# Patient Record
Sex: Female | Born: 1945
Health system: Southern US, Community
[De-identification: ages and names within clinical notes are randomized; demographics above are authoritative.]

## PROBLEM LIST (undated history)

## (undated) DIAGNOSIS — I10 Essential (primary) hypertension: Secondary | ICD-10-CM

## (undated) DIAGNOSIS — K8020 Calculus of gallbladder without cholecystitis without obstruction: Secondary | ICD-10-CM

## (undated) HISTORY — DX: Calculus of gallbladder without cholecystitis without obstruction: K80.20

## (undated) HISTORY — DX: Essential (primary) hypertension: I10

---

## 2002-08-30 ENCOUNTER — Emergency Department (HOSPITAL_COMMUNITY): Admission: AD | Admit: 2002-08-30 | Discharge: 2002-08-30 | Payer: Self-pay | Admitting: Emergency Medicine

## 2005-02-21 ENCOUNTER — Observation Stay (HOSPITAL_COMMUNITY): Admission: EM | Admit: 2005-02-21 | Discharge: 2005-02-23 | Payer: Self-pay | Admitting: *Deleted

## 2011-01-21 ENCOUNTER — Emergency Department (HOSPITAL_COMMUNITY): Payer: Self-pay

## 2011-01-21 ENCOUNTER — Emergency Department (HOSPITAL_COMMUNITY)
Admission: EM | Admit: 2011-01-21 | Discharge: 2011-01-22 | Disposition: A | Discharge status: Home / Self Care | Payer: Self-pay | Attending: Emergency Medicine | Admitting: Emergency Medicine

## 2011-01-21 DIAGNOSIS — R509 Fever, unspecified: Secondary | ICD-10-CM | POA: Insufficient documentation

## 2011-01-21 DIAGNOSIS — I10 Essential (primary) hypertension: Secondary | ICD-10-CM | POA: Insufficient documentation

## 2011-01-21 DIAGNOSIS — D259 Leiomyoma of uterus, unspecified: Secondary | ICD-10-CM | POA: Insufficient documentation

## 2011-01-21 DIAGNOSIS — R51 Headache: Secondary | ICD-10-CM | POA: Insufficient documentation

## 2011-01-21 DIAGNOSIS — K8020 Calculus of gallbladder without cholecystitis without obstruction: Secondary | ICD-10-CM | POA: Insufficient documentation

## 2011-01-21 LAB — URINALYSIS, ROUTINE W REFLEX MICROSCOPIC
Glucose, UA: NEGATIVE mg/dL
Ketones, ur: NEGATIVE mg/dL
Nitrite: NEGATIVE
Protein, ur: 100 mg/dL — AB
Specific Gravity, Urine: 1.023 (ref 1.005–1.030)
Urobilinogen, UA: 1 mg/dL (ref 0.0–1.0)
pH: 6 (ref 5.0–8.0)

## 2011-01-21 LAB — CBC
HCT: 42.4 % (ref 36.0–46.0)
Hemoglobin: 14.6 g/dL (ref 12.0–15.0)
MCH: 28.1 pg (ref 26.0–34.0)
MCV: 81.7 fL (ref 78.0–100.0)
Platelets: 256 10*3/uL (ref 150–400)
RBC: 5.19 MIL/uL — ABNORMAL HIGH (ref 3.87–5.11)
RDW: 14.8 % (ref 11.5–15.5)
WBC: 14.7 10*3/uL — ABNORMAL HIGH (ref 4.0–10.5)

## 2011-01-21 LAB — DIFFERENTIAL
Basophils Absolute: 0 10*3/uL (ref 0.0–0.1)
Basophils Relative: 0 % (ref 0–1)
Eosinophils Absolute: 0 10*3/uL (ref 0.0–0.7)
Lymphocytes Relative: 10 % — ABNORMAL LOW (ref 12–46)
Lymphs Abs: 1.4 10*3/uL (ref 0.7–4.0)
Monocytes Absolute: 1.2 10*3/uL — ABNORMAL HIGH (ref 0.1–1.0)
Neutro Abs: 12 10*3/uL — ABNORMAL HIGH (ref 1.7–7.7)
Neutrophils Relative %: 82 % — ABNORMAL HIGH (ref 43–77)

## 2011-01-21 LAB — LIPASE, BLOOD: Lipase: 16 U/L (ref 11–59)

## 2011-01-21 LAB — COMPREHENSIVE METABOLIC PANEL
AST: 28 U/L (ref 0–37)
Alkaline Phosphatase: 105 U/L (ref 39–117)
BUN: 16 mg/dL (ref 6–23)
CO2: 29 mEq/L (ref 19–32)
Chloride: 97 mEq/L (ref 96–112)
Creatinine, Ser: 0.77 mg/dL (ref 0.50–1.10)
GFR calc Af Amer: >90 mL/min (ref >90)
GFR calc non Af Amer: 88 mL/min — ABNORMAL LOW (ref >90)
Potassium: 3.2 mEq/L — ABNORMAL LOW (ref 3.5–5.1)
Sodium: 137 mEq/L (ref 135–145)
Total Bilirubin: 0.5 mg/dL (ref 0.3–1.2)
Total Protein: 8.4 g/dL — ABNORMAL HIGH (ref 6.0–8.3)

## 2011-01-21 LAB — URINE MICROSCOPIC-ADD ON

## 2011-01-21 MED ORDER — IOHEXOL 300 MG/ML  SOLN
100.0000 mL | Freq: Once | INTRAMUSCULAR | Status: AC | PRN
  Administered 2011-01-21: 100 mL via INTRAVENOUS

## 2011-01-23 LAB — URINE CULTURE
Colony Count: NO GROWTH
Culture  Setup Time: 201210210413
Culture: NO GROWTH

## 2011-01-31 ENCOUNTER — Ambulatory Visit (INDEPENDENT_AMBULATORY_CARE_PROVIDER_SITE_OTHER): Payer: Self-pay | Admitting: General Surgery

## 2011-01-31 ENCOUNTER — Encounter (INDEPENDENT_AMBULATORY_CARE_PROVIDER_SITE_OTHER): Payer: Self-pay | Admitting: General Surgery

## 2011-01-31 VITALS — BP 154/92 | HR 84 | Temp 98.2°F | Resp 16 | Ht 65.0 in | Wt 205.2 lb

## 2011-01-31 DIAGNOSIS — K8010 Calculus of gallbladder with chronic cholecystitis without obstruction: Secondary | ICD-10-CM

## 2011-01-31 NOTE — Progress Notes (Signed)
Subjective:     Patient ID: Mykala Mccready, female   DOB: 05/22/1945, 65 y.o.   MRN: [id number]  HPIMiss Gural is a 65 year old female who is in the emergency room last Saturday that she awoke in the morning with episode of pain and in 2 weeks earlier she had a similar so that she did not get to the emergency room she is retired she used to work for the city as Designer, jewellery and she is to see a physician on a regular basis [place] and was told that she had high blood pressure but she's not on any blood pressure medications at this time in the emergency room she was same and [facility] who did laboratory studies she had a mildly elevated white count with left shift a CT was then performed and this showed a distended gallbladder but no definite stone she's got a little bit of a fatty infiltrate of her liver and then I did an ultrasound that showed numerous little small stones that, layer I. in the neck of the gallbladder or f his were normal and he gave her a prescription for Vicodin 5 325 and suggested she see a surgeon and she is here today now approximately 3 days later she says she still problems with nausea but not as bad of pain and she's not actually been vomiting   The patient states that her pain is not as severe but she is still having nausea and if you push in the right corpora abdomen she still somewhat sore   Review of SystemsHistory reviewed. No pertinent past surgical history. No Known Allergies Current Outpatient Prescriptions  Medication Sig Dispense Refill  . HYDROcodone-acetaminophen (NORCO) 5-325 MG per tablet Take 1 tablet by mouth every 6 (six) hours as needed.          In the emergency room duct status and an EKG that showed this changes consistent with ventricular mitral hypertrophy and looked like she had possibly an old MI sometime in the past but they located another x-ray another EKG in 2006 for comparison and they have been no change as been on blood  pressure medications during that interval but she is normal blood pressure medications at this time she says it is not do a chest x-ray eyes ears nose and throat negative chest she does not smoke cigarettes and denies any angina or chest type symptoms both of her episodes of gallbladder colic did not radiate to her chest or left arm. Abdomen she is at her previous hysterectomy no abdominal surgery    Objective:   Physical ExamBP 154/92  Pulse 84  Temp(Src) 98.2 F (36.8 C) (Temporal)  Resp 16  Ht 5\' 5"  (1.651 m)  Wt 205 lb 3.2 oz (93.078 kg)  BMI 34.15 kg/m2 HEENT normal no carotid bruits no cervical supraclavicular lymphadenopathy a did not do a breast exam her lungs are clear cardiac normal sinus rhythm abdomen she is mildly tender in the right upper quadrant she is moderately overweight but I don't appreciate any obvious hernias even though the CT showed a little weakness at the table rectal examination is brown stool Hemoccult negative C&S is physiologic     Assessment:    Symptomatic gallstones small gallstones with an elevated white count when she was in the ER Saturday still mildly tender and she may have to have a subacute cholecystitis I would recommend that we go ahead and proceed on Monday, urgent cholecystectomy I can schedule her for Thursday day  after tomorrow. She'll bring a bag and understands that if her stones in the bile duct husband and I if she would elect to go home after which her husband is retired and will be with her we will m that decision about whether this would go home in the recovery room in     Plan:    Schedule choleystectomy with x-ray Thursday at [facility]

## 2011-01-31 NOTE — Patient Instructions (Signed)
Low-fat small food and did not eating bed and see if we can schedule her surgery for Thursday of this week

## 2011-02-02 ENCOUNTER — Other Ambulatory Visit (INDEPENDENT_AMBULATORY_CARE_PROVIDER_SITE_OTHER): Payer: Self-pay | Admitting: General Surgery

## 2011-02-02 ENCOUNTER — Ambulatory Visit (HOSPITAL_COMMUNITY): Payer: Self-pay

## 2011-02-02 ENCOUNTER — Ambulatory Visit (HOSPITAL_COMMUNITY)
Admission: RE | Admit: 2011-02-02 | Discharge: 2011-02-03 | Disposition: A | Discharge status: Home / Self Care | Payer: Self-pay | Source: Ambulatory Visit | Attending: General Surgery | Admitting: General Surgery

## 2011-02-02 DIAGNOSIS — K8000 Calculus of gallbladder with acute cholecystitis without obstruction: Secondary | ICD-10-CM | POA: Insufficient documentation

## 2011-02-02 DIAGNOSIS — Z9119 Patient's noncompliance with other medical treatment and regimen: Secondary | ICD-10-CM | POA: Insufficient documentation

## 2011-02-02 DIAGNOSIS — I10 Essential (primary) hypertension: Secondary | ICD-10-CM | POA: Insufficient documentation

## 2011-02-02 DIAGNOSIS — Z91199 Patient's noncompliance with other medical treatment and regimen due to unspecified reason: Secondary | ICD-10-CM | POA: Insufficient documentation

## 2011-02-02 LAB — COMPREHENSIVE METABOLIC PANEL
AST: 38 U/L — ABNORMAL HIGH (ref 0–37)
Albumin: 3.5 g/dL (ref 3.5–5.2)
BUN: 9 mg/dL (ref 6–23)
CO2: 30 mEq/L (ref 19–32)
Calcium: 9.8 mg/dL (ref 8.4–10.5)
Chloride: 101 mEq/L (ref 96–112)
Creatinine, Ser: 0.85 mg/dL (ref 0.50–1.10)
GFR calc Af Amer: 83 mL/min — ABNORMAL LOW (ref >90)
GFR calc non Af Amer: 71 mL/min — ABNORMAL LOW (ref >90)
Glucose, Bld: 101 mg/dL — ABNORMAL HIGH (ref 70–99)
Potassium: 3.5 mEq/L (ref 3.5–5.1)
Total Bilirubin: 0.2 mg/dL — ABNORMAL LOW (ref 0.3–1.2)
Total Protein: 8 g/dL (ref 6.0–8.3)

## 2011-02-02 LAB — DIFFERENTIAL
Basophils Absolute: 0 10*3/uL (ref 0.0–0.1)
Eosinophils Absolute: 0.2 10*3/uL (ref 0.0–0.7)
Eosinophils Relative: 3 % (ref 0–5)
Lymphocytes Relative: 19 % (ref 12–46)
Lymphs Abs: 1.5 10*3/uL (ref 0.7–4.0)
Monocytes Absolute: 0.6 10*3/uL (ref 0.1–1.0)
Monocytes Relative: 8 % (ref 3–12)
Neutrophils Relative %: 70 % (ref 43–77)

## 2011-02-02 LAB — CBC
HCT: 39.2 % (ref 36.0–46.0)
Hemoglobin: 12.9 g/dL (ref 12.0–15.0)
MCH: 27.3 pg (ref 26.0–34.0)
MCHC: 32.9 g/dL (ref 30.0–36.0)
MCV: 83.1 fL (ref 78.0–100.0)
Platelets: 361 10*3/uL (ref 150–400)
RBC: 4.72 MIL/uL (ref 3.87–5.11)
RDW: 14.1 % (ref 11.5–15.5)
WBC: 7.7 10*3/uL (ref 4.0–10.5)

## 2011-02-02 LAB — SURGICAL PCR SCREEN
MRSA, PCR: NEGATIVE
Staphylococcus aureus: NEGATIVE

## 2011-02-03 HISTORY — PX: CHOLECYSTECTOMY: SHX55

## 2011-02-03 LAB — COMPREHENSIVE METABOLIC PANEL
AST: 44 U/L — ABNORMAL HIGH (ref 0–37)
Alkaline Phosphatase: 99 U/L (ref 39–117)
BUN: 6 mg/dL (ref 6–23)
CO2: 31 mEq/L (ref 19–32)
Chloride: 102 mEq/L (ref 96–112)
Creatinine, Ser: 0.81 mg/dL (ref 0.50–1.10)
GFR calc non Af Amer: 76 mL/min — ABNORMAL LOW (ref >90)
Total Bilirubin: 0.3 mg/dL (ref 0.3–1.2)

## 2011-02-09 ENCOUNTER — Ambulatory Visit (INDEPENDENT_AMBULATORY_CARE_PROVIDER_SITE_OTHER): Payer: Self-pay | Admitting: General Surgery

## 2011-02-09 ENCOUNTER — Encounter (INDEPENDENT_AMBULATORY_CARE_PROVIDER_SITE_OTHER): Payer: Self-pay | Admitting: General Surgery

## 2011-02-09 VITALS — BP 198/116 | HR 76 | Temp 98.8°F | Resp 16 | Ht 65.0 in | Wt 204.6 lb

## 2011-02-09 DIAGNOSIS — Z5189 Encounter for other specified aftercare: Secondary | ICD-10-CM

## 2011-02-09 DIAGNOSIS — Z4889 Encounter for other specified surgical aftercare: Secondary | ICD-10-CM

## 2011-02-09 NOTE — Progress Notes (Signed)
Subjective:     Patient ID: Teresa Fry, female   DOB: 11-25-1945, 65 y.o.   MRN: [id number]  HPI Patient followed up one week status post cholecystectomy for acute cholecystitis. Pathology was consistent with acute cholecystitis she had a drain placed at the time of surgery. She states it hasn't really put out anything in the last few days. She states that she has been improving but still has some lateral quadrant discomfort and back pain denies any fevers or chills and states that she has been tolerating diet.  Review of Systems     Objective:   Physical Exam No acute distress and nontoxic-appearing  Her abdomen is soft and appropriate incisional tenderness. Her incisions are healing well without sign of infection. She has a JP drain from the right lateral abdomen with serosanguineous output there is no evidence of any bile in the drain. Drain was removed in clinic today in the wound was redressed    Assessment:     Status post exploratory laparoscopy and cholecystectomy for acute cholecystitis. She seems to be improving. Her drain was removed today in clinic that there was no evidence of any bile. I recommended that she gradually increase activity and diet and follow with Dr. Zachery Dakins in one week for repeat evaluation. I suspect that she will continue to improve daily.    Plan:     F/u in 1 week

## 2011-02-13 ENCOUNTER — Encounter (INDEPENDENT_AMBULATORY_CARE_PROVIDER_SITE_OTHER): Payer: Self-pay | Admitting: General Surgery

## 2011-02-28 ENCOUNTER — Encounter (INDEPENDENT_AMBULATORY_CARE_PROVIDER_SITE_OTHER): Payer: Self-pay | Admitting: General Surgery

## 2011-02-28 ENCOUNTER — Ambulatory Visit (INDEPENDENT_AMBULATORY_CARE_PROVIDER_SITE_OTHER): Payer: Self-pay | Admitting: General Surgery

## 2011-02-28 VITALS — BP 148/82 | HR 70 | Temp 96.8°F | Resp 16 | Ht 65.0 in | Wt 203.6 lb

## 2011-02-28 DIAGNOSIS — K8000 Calculus of gallbladder with acute cholecystitis without obstruction: Secondary | ICD-10-CM

## 2011-02-28 NOTE — Progress Notes (Signed)
Patient ID: Teresa Fry, female   DOB: 1945/08/09, 65 y.o.   MRN: [id number] This is a very returned she spell one month following her arthroscopic cholecystectomy cholangiogram she had acute cholecystitis and was seen by a week after surgery by Dr. Darylene Price I placed a Blake drain which he removed and she says she's doing fine she's had no change in bowel movements its nicely and it would even have an pain after her incisions have healed nicely. The patient is presently not working she has one child at Christmas and will see [place] on a p.r.n. basis she is on room food no dietary restrictions and has done nicely following this urgent cholecystectomy with cholangiogram. Her path report showed acute cholecystitis with stones BP 148/82  Pulse 70  Temp(Src) 96.8 F (36 C) (Temporal)  Resp 16  Ht 5\' 5"  (1.651 m)  Wt 203 lb 9.6 oz (92.352 kg)  BMI 33.88 kg/m2

## 2013-04-29 DIAGNOSIS — I10 Essential (primary) hypertension: Secondary | ICD-10-CM | POA: Diagnosis not present

## 2013-04-29 DIAGNOSIS — E119 Type 2 diabetes mellitus without complications: Secondary | ICD-10-CM | POA: Diagnosis not present

## 2013-04-29 DIAGNOSIS — E785 Hyperlipidemia, unspecified: Secondary | ICD-10-CM | POA: Diagnosis not present

## 2014-02-12 DIAGNOSIS — Z23 Encounter for immunization: Secondary | ICD-10-CM | POA: Diagnosis not present

## 2014-06-23 DIAGNOSIS — E119 Type 2 diabetes mellitus without complications: Secondary | ICD-10-CM | POA: Diagnosis not present

## 2014-06-23 DIAGNOSIS — E785 Hyperlipidemia, unspecified: Secondary | ICD-10-CM | POA: Diagnosis not present

## 2014-06-23 DIAGNOSIS — I10 Essential (primary) hypertension: Secondary | ICD-10-CM | POA: Diagnosis not present

## 2015-03-10 ENCOUNTER — Ambulatory Visit (INDEPENDENT_AMBULATORY_CARE_PROVIDER_SITE_OTHER): Payer: Medicare Other | Admitting: Internal Medicine

## 2015-03-10 ENCOUNTER — Encounter: Payer: Self-pay | Admitting: Internal Medicine

## 2015-03-10 ENCOUNTER — Other Ambulatory Visit: Payer: Self-pay | Admitting: Internal Medicine

## 2015-03-10 VITALS — BP 142/82 | HR 96 | Temp 98.3°F | Ht 64.25 in | Wt 210.0 lb

## 2015-03-10 DIAGNOSIS — M545 Low back pain, unspecified: Secondary | ICD-10-CM

## 2015-03-10 DIAGNOSIS — T148 Other injury of unspecified body region: Secondary | ICD-10-CM

## 2015-03-10 DIAGNOSIS — I10 Essential (primary) hypertension: Secondary | ICD-10-CM | POA: Insufficient documentation

## 2015-03-10 DIAGNOSIS — T148XXA Other injury of unspecified body region, initial encounter: Secondary | ICD-10-CM

## 2015-03-10 MED ORDER — METHOCARBAMOL 500 MG PO TABS: 500.0000 mg | ORAL_TABLET | Freq: Three times a day (TID) | ORAL | Status: DC | PRN

## 2015-03-10 MED ORDER — AMLODIPINE BESYLATE 10 MG PO TABS: 10.0000 mg | ORAL_TABLET | Freq: Every day | ORAL | Status: DC

## 2015-03-10 MED ORDER — LISINOPRIL 40 MG PO TABS: 40.0000 mg | ORAL_TABLET | Freq: Every day | ORAL | Status: DC

## 2015-03-10 NOTE — Patient Instructions (Addendum)
Hypertension Hypertension, commonly called high blood pressure, is when the force of blood pumping through your arteries is too strong. Your arteries are the blood vessels that carry blood from your heart throughout your body. A blood pressure reading consists of a higher number over a lower number, such as 110/72. The higher number (systolic) is the pressure inside your arteries when your heart pumps. The lower number (diastolic) is the pressure inside your arteries when your heart relaxes. Ideally you want your blood pressure below 120/80. Hypertension forces your heart to work harder to pump blood. Your arteries may become narrow or stiff. Having untreated or uncontrolled hypertension can cause heart attack, stroke, kidney disease, and other problems. RISK FACTORS Some risk factors for high blood pressure are controllable. Others are not.  Risk factors you cannot control include:  1. Race. You may be at higher risk if you are African American. 2. Age. Risk increases with age. 3. Gender. Men are at higher risk than women before age 24 years. After age 57, women are at higher risk than men. Risk factors you can control include: 1. Not getting enough exercise or physical activity. 2. Being overweight. 3. Getting too much fat, sugar, calories, or salt in your diet. 4. Drinking too much alcohol. SIGNS AND SYMPTOMS Hypertension does not usually cause signs or symptoms. Extremely high blood pressure (hypertensive crisis) may cause headache, anxiety, shortness of breath, and nosebleed. DIAGNOSIS To check if you have hypertension, your health care provider will measure your blood pressure while you are seated, with your arm held at the level of your heart. It should be measured at least twice using the same arm. Certain conditions can cause a difference in blood pressure between your right and left arms. A blood pressure reading that is higher than normal on one occasion does not mean that you need  treatment. If it is not clear whether you have high blood pressure, you may be asked to return on a different day to have your blood pressure checked again. Or, you may be asked to monitor your blood pressure at home for 1 or more weeks. TREATMENT Treating high blood pressure includes making lifestyle changes and possibly taking medicine. Living a healthy lifestyle can help lower high blood pressure. You may need to change some of your habits. Lifestyle changes may include: 1. Following the DASH diet. This diet is high in fruits, vegetables, and whole grains. It is low in salt, red meat, and added sugars. 2. Keep your sodium intake below 2,300 mg per day. 3. Getting at least 30-45 minutes of aerobic exercise at least 4 times per week. 4. Losing weight if necessary. 5. Not smoking. 6. Limiting alcoholic beverages. 7. Learning ways to reduce stress. Your health care provider may prescribe medicine if lifestyle changes are not enough to get your blood pressure under control, and if one of the following is true: 1. You are 41-26 years of age and your systolic blood pressure is above 140. 2. You are 27 years of age or older, and your systolic blood pressure is above [id number]. 3. Your diastolic blood pressure is above 90. 4. You have diabetes, and your systolic blood pressure is over [id number] or your diastolic blood pressure is over 90. 5. You have kidney disease and your blood pressure is above 140/90. 6. You have heart disease and your blood pressure is above 140/90. Your personal target blood pressure may vary depending on your medical conditions, your age, and other factors. HOME CARE INSTRUCTIONS  1. Have your blood pressure rechecked as directed by your health care provider.  2. Take medicines only as directed by your health care provider. Follow the directions carefully. Blood pressure medicines must be taken as prescribed. The medicine does not work as well when you skip doses. Skipping doses also puts  you at risk for problems. 3. Do not smoke.  4. Monitor your blood pressure at home as directed by your health care provider. SEEK MEDICAL CARE IF:  1. You think you are having a reaction to medicines taken. 2. You have recurrent headaches or feel dizzy. 3. You have swelling in your ankles. 4. You have trouble with your vision. SEEK IMMEDIATE MEDICAL CARE IF: 1. You develop a severe headache or confusion. 2. You have unusual weakness, numbness, or feel faint. 3. You have severe chest or abdominal pain. 4. You vomit repeatedly. 5. You have trouble breathing. MAKE SURE YOU:   Understand these instructions.  Will watch your condition.  Will get help right away if you are not doing well or get worse.   This information is not intended to replace advice given to you by your health care provider. Make sure you discuss any questions you have with your health care provider.   Document Released: 03/20/2005 Document Revised: 08/04/2014 Document Reviewed: 01/10/2013 Elsevier Interactive Patient Education 2016 [employer]. Back Exercises If you have pain in your back, do these exercises 2-3 times each day or as told by your doctor. When the pain goes away, do the exercises once each day, but repeat the steps more times for each exercise (do more repetitions). If you do not have pain in your back, do these exercises once each day or as told by your doctor. EXERCISES Single Knee to Chest Do these steps 3-5 times in a row for each leg: 4. Lie on your back on a firm bed or the floor with your legs stretched out. 5. Bring one knee to your chest. 6. Hold your knee to your chest by grabbing your knee or thigh. 7. Pull on your knee until you feel a gentle stretch in your lower back. 8. Keep doing the stretch for 10-30 seconds. 9. Slowly let go of your leg and straighten it. Pelvic Tilt Do these steps 5-10 times in a row: 5. Lie on your back on a firm bed or the floor with your legs stretched  out. 6. Bend your knees so they point up to the ceiling. Your feet should be flat on the floor. 7. Tighten your lower belly (abdomen) muscles to press your lower back against the floor. This will make your tailbone point up to the ceiling instead of pointing down to your feet or the floor. 8. Stay in this position for 5-10 seconds while you gently tighten your muscles and breathe evenly. Cat-Cow Do these steps until your lower back bends more easily: 8. Get on your hands and knees on a firm surface. Keep your hands under your shoulders, and keep your knees under your hips. You may put padding under your knees. 9. Let your head hang down, and make your tailbone point down to the floor so your lower back is round like the back of a cat. 10. Stay in this position for 5 seconds. 11. Slowly lift your head and make your tailbone point up to the ceiling so your back hangs low (sags) like the back of a cow. 12. Stay in this position for 5 seconds. Press-Ups Do these steps 5-10 times in  a row: 7. Lie on your belly (face-down) on the floor. 8. Place your hands near your head, about shoulder-width apart. 9. While you keep your back relaxed and keep your hips on the floor, slowly straighten your arms to raise the top half of your body and lift your shoulders. Do not use your back muscles. To make yourself more comfortable, you may change where you place your hands. 10. Stay in this position for 5 seconds. 11. Slowly return to lying flat on the floor. Bridges Do these steps 10 times in a row: 5. Lie on your back on a firm surface. 6. Bend your knees so they point up to the ceiling. Your feet should be flat on the floor. 7. Tighten your butt muscles and lift your butt off of the floor until your waist is almost as high as your knees. If you do not feel the muscles working in your butt and the back of your thighs, slide your feet 1-2 inches farther away from your butt. 8. Stay in this position for 3-5  seconds. 9. Slowly lower your butt to the floor, and let your butt muscles relax. If this exercise is too easy, try doing it with your arms crossed over your chest. Belly Crunches Do these steps 5-10 times in a row: 5. Lie on your back on a firm bed or the floor with your legs stretched out. 6. Bend your knees so they point up to the ceiling. Your feet should be flat on the floor. 7. Cross your arms over your chest. 8. Tip your chin a little bit toward your chest but do not bend your neck. 9. Tighten your belly muscles and slowly raise your chest just enough to lift your shoulder blades a tiny bit off of the floor. 10. Slowly lower your chest and your head to the floor. Back Lifts Do these steps 5-10 times in a row: 6. Lie on your belly (face-down) with your arms at your sides, and rest your forehead on the floor. 7. Tighten the muscles in your legs and your butt. 8. Slowly lift your chest off of the floor while you keep your hips on the floor. Keep the back of your head in line with the curve in your back. Look at the floor while you do this. 9. Stay in this position for 3-5 seconds. 10. Slowly lower your chest and your face to the floor. GET HELP IF:  Your back pain gets a lot worse when you do an exercise.  Your back pain does not lessen 2 hours after you exercise. If you have any of these problems, stop doing the exercises. Do not do them again unless your doctor says it is okay. GET HELP RIGHT AWAY IF:  You have sudden, very bad back pain. If this happens, stop doing the exercises. Do not do them again unless your doctor says it is okay.   This information is not intended to replace advice given to you by your health care provider. Make sure you discuss any questions you have with your health care provider.   Document Released: 04/22/2010 Document Revised: 12/09/2014 Document Reviewed: 05/14/2014 Elsevier Interactive Patient Education [employer].

## 2015-03-10 NOTE — Progress Notes (Signed)
HPI  Pt presents to the clinic today to establish care and for management of the conditions listed below. She has not had a PCP in many years.  HTN: She takes Norvasc and Lisinopril. Her BP today is 142/82. She checks it at home, and it has been running in the 140'/80's.  She c/o low back pain. This started 3 weeks ago. The pain is across her lower back. She describes the pain as soreness. The pain does not radiate. She denies numbness or tingling down the legs. It seems worse when she sits down. She denies injury to the area. She denies dysuria, urgency and frequency. She took Aleve with some relief.  Flu: 01/2014 Tetanus: > 10 years ago Pneumovax: never Prevnar: never Zostovax: never Pap Smear: > 5 years ago Mammogram: > 5 years ago Bone Density: never Colon Screening: never Vision: as needed Dentist: as needed  Past Medical History  Diagnosis Date  . Hypertension   . Gallstones     Current Outpatient Prescriptions  Medication Sig Dispense Refill  . amLODipine (NORVASC) 10 MG tablet Take 10 mg by mouth daily.     Marland Kitchen aspirin 81 MG tablet Take 81 mg by mouth daily.      Marland Kitchen lisinopril (PRINIVIL,ZESTRIL) 40 MG tablet Take 40 mg by mouth daily.     . Multiple Vitamin (MULTIVITAMIN) capsule Take 1 capsule by mouth daily.      . niacin 500 MG tablet Take 1,000 mg by mouth daily.     No current facility-administered medications for this visit.    No Known Allergies  Family History  Problem Relation Age of Onset  . Cancer Daughter     breast  . Heart disease Father     Social History   Social History  . Marital Status: Married    Spouse Name: N/A  . Number of Children: N/A  . Years of Education: N/A   Occupational History  . Not on file.   Social History Main Topics  . Smoking status: Never Smoker   . Smokeless tobacco: Never Used  . Alcohol Use: No  . Drug Use: No  . Sexual Activity: Not on file   Other Topics Concern  . Not on file   Social History  Narrative    ROS:  Constitutional: Denies fever, malaise, fatigue, headache or abrupt weight changes.  Respiratory: Denies difficulty breathing, shortness of breath, cough or sputum production.   Cardiovascular: Denies chest pain, chest tightness, palpitations or swelling in the hands or feet.  Gastrointestinal: Denies abdominal pain, bloating, constipation, diarrhea or blood in the stool.  GU: Denies frequency, urgency, pain with urination, blood in urine, odor or discharge. Musculoskeletal: Pt reports back pain. Denies decrease in range of motion, difficulty with gait, or joint pain and swelling.  Skin: Denies redness, rashes, lesions or ulcercations.  Neurological: Denies dizziness, difficulty with memory, difficulty with speech or problems with balance and coordination.  Psych: Denies anxiety, depression, SI/HI.  No other specific complaints in a complete review of systems (except as listed in HPI above).  PE:  BP 142/82 mmHg  Pulse 96  Temp(Src) 98.3 F (36.8 C) (Oral)  Ht 5' 4.25" (1.632 m)  Wt 210 lb (95.255 kg)  BMI 35.76 kg/m2  SpO2 98%  Wt Readings from Last 3 Encounters:  03/10/15 210 lb (95.255 kg)  02/28/11 203 lb 9.6 oz (92.352 kg)  02/09/11 204 lb 9.6 oz (92.806 kg)    General: Appears her stated age, obese in NAD. Cardiovascular:  Normal rate and rhythm. S1,S2 noted.  No murmur, rubs or gallops noted.  Pulmonary/Chest: Normal effort and positive vesicular breath sounds. No respiratory distress. No wheezes, rales or ronchi noted.  Musculoskeletal: Normal flexion and rotation of the spine. She does have pain with extension. No bony tenderness noted over the spine. Strength 5/5 BLE.  No difficulty with gait.  Neurological: Alert and oriented.  Psychiatric: Mood and affect normal. Behavior is normal. Judgment and thought content normal.     BMET    Component Value Date/Time   NA 140 02/03/2011 0405   K 3.5 02/03/2011 0405   CL 102 02/03/2011 0405   CO2 31  02/03/2011 0405   GLUCOSE 110* 02/03/2011 0405   BUN 6 02/03/2011 0405   CREATININE 0.81 02/03/2011 0405   CALCIUM 9.2 02/03/2011 0405   GFRNONAA 76* 02/03/2011 0405   GFRAA 88* 02/03/2011 0405    Lipid Panel  No results found for: CHOL, TRIG, HDL, CHOLHDL, VLDL, LDLCALC  CBC    Component Value Date/Time   WBC 7.7 02/02/2011 1024   RBC 4.72 02/02/2011 1024   HGB 12.9 02/02/2011 1024   HCT 39.2 02/02/2011 1024   PLT 361 02/02/2011 1024   MCV 83.1 02/02/2011 1024   MCH 27.3 02/02/2011 1024   MCHC 32.9 02/02/2011 1024   RDW 14.1 02/02/2011 1024   LYMPHSABS 1.5 02/02/2011 1024   MONOABS 0.6 02/02/2011 1024   EOSABS 0.2 02/02/2011 1024   BASOSABS 0.0 02/02/2011 1024    Hgb A1C No results found for: HGBA1C   Assessment and Plan:  Muscle strain of low back:  Take Aleve 1 tab BID with food A heating pad may be helpful Stretching exercises given eRx for Robaxin 500 mg as needed  Make an appt for your Medicare Wellness Exam

## 2015-03-10 NOTE — Assessment & Plan Note (Signed)
Slightly elevated today but she is in pain Will check CMET Norvasc and Lisinopril refilled today

## 2015-03-10 NOTE — Progress Notes (Signed)
Pre visit review using our clinic review tool, if applicable. No additional management support is needed unless otherwise documented below in the visit note. 

## 2015-03-11 LAB — COMPREHENSIVE METABOLIC PANEL
ALK PHOS: 81 U/L (ref 39–117)
ALT: 21 U/L (ref 0–35)
AST: 28 U/L (ref 0–37)
Albumin: 4.2 g/dL (ref 3.5–5.2)
BUN: 13 mg/dL (ref 6–23)
CO2: 29 mEq/L (ref 19–32)
Calcium: 10.2 mg/dL (ref 8.4–10.5)
Chloride: 104 mEq/L (ref 96–112)
Creatinine, Ser: 0.85 mg/dL (ref 0.40–1.20)
GFR: 85.55 mL/min (ref >60.00)
GLUCOSE: 98 mg/dL (ref 70–99)
POTASSIUM: 3.6 meq/L (ref 3.5–5.1)
SODIUM: 142 meq/L (ref 135–145)
TOTAL PROTEIN: 7.9 g/dL (ref 6.0–8.3)
Total Bilirubin: 0.3 mg/dL (ref 0.2–1.2)

## 2015-08-03 ENCOUNTER — Ambulatory Visit (INDEPENDENT_AMBULATORY_CARE_PROVIDER_SITE_OTHER): Payer: Medicare Other | Admitting: Internal Medicine

## 2015-08-03 ENCOUNTER — Encounter: Payer: Self-pay | Admitting: Internal Medicine

## 2015-08-03 VITALS — BP 142/90 | HR 83 | Temp 97.8°F | Wt 200.5 lb

## 2015-08-03 DIAGNOSIS — L03312 Cellulitis of back [any part except buttock]: Secondary | ICD-10-CM | POA: Diagnosis not present

## 2015-08-03 DIAGNOSIS — L02212 Cutaneous abscess of back [any part, except buttock]: Secondary | ICD-10-CM

## 2015-08-03 MED ORDER — SULFAMETHOXAZOLE-TRIMETHOPRIM 800-160 MG PO TABS
1.0000 | ORAL_TABLET | Freq: Two times a day (BID) | ORAL | Status: DC
Start: 2015-08-03 — End: 2015-08-26

## 2015-08-03 NOTE — Progress Notes (Signed)
Subjective:    Patient ID: Teresa Fry, female    DOB: 01-02-1946, 70 y.o.   MRN: [id number]  HPI  Pt presents to the clinic today with c/o a spot on her left upper back. This started 3 days ago. The area itches. She has noticed swelling and drainage from the site. She reports it is now tender to the touch.She denies fever, chills or body aches. She has not tried anything OTC.  Review of Systems  Past Medical History  Diagnosis Date  . Hypertension   . Gallstones     Current Outpatient Prescriptions  Medication Sig Dispense Refill  . amLODipine (NORVASC) 10 MG tablet Take 1 tablet (10 mg total) by mouth daily. 30 tablet 5  . aspirin 81 MG tablet Take 81 mg by mouth daily.      Marland Kitchen lisinopril (PRINIVIL,ZESTRIL) 40 MG tablet Take 1 tablet (40 mg total) by mouth daily. 30 tablet 5  . methocarbamol (ROBAXIN) 500 MG tablet Take 1 tablet (500 mg total) by mouth every 8 (eight) hours as needed for muscle spasms. 30 tablet 0  . Multiple Vitamin (MULTIVITAMIN) capsule Take 1 capsule by mouth daily.      . niacin 500 MG tablet Take 1,000 mg by mouth daily.    Marland Kitchen sulfamethoxazole-trimethoprim (BACTRIM DS,SEPTRA DS) 800-160 MG tablet Take 1 tablet by mouth 2 (two) times daily. 20 tablet 0   No current facility-administered medications for this visit.    No Known Allergies  Family History  Problem Relation Age of Onset  . Cancer Daughter     breast  . Heart disease Father     Social History   Social History  . Marital Status: Married    Spouse Name: N/A  . Number of Children: N/A  . Years of Education: N/A   Occupational History  . Not on file.   Social History Main Topics  . Smoking status: Never Smoker   . Smokeless tobacco: Never Used  . Alcohol Use: No  . Drug Use: No  . Sexual Activity: No   Other Topics Concern  . Not on file   Social History Narrative     Constitutional: Denies fever or headache . Respiratory: Denies difficulty breathing or shortness of  breath. Cardiovascular: Denies chest pain, chest tightness, or palpitations.  Skin: Pt reports skin lesion of left upper back.   No other specific complaints in a complete review of systems (except as listed in HPI above).     Objective:   Physical Exam  BP 142/90 mmHg  Pulse 83  Temp(Src) 97.8 F (36.6 C) (Oral)  Wt 200 lb 8 oz (90.946 kg)  SpO2 98% Wt Readings from Last 3 Encounters:  08/03/15 200 lb 8 oz (90.946 kg)  03/10/15 210 lb (95.255 kg)  02/28/11 203 lb 9.6 oz (92.352 kg)    General: Appears her stated age and in NAD. Skin: 4 cm x 5 cm abscess on upper back. Area is already open and draining with a pinpoint open area just superior to the main abscess. Pus draining from the area. Surrounding cellulitis noted. No signs of streaking.   Cardiovascular: Normal rate and rhythm. S1,S2 noted.  No murmur, rubs or gallops noted. Pulmonary/Chest: Normal effort and positive vesicular breath sounds. No respiratory distress. No wheezes, rales or ronchi noted.    BMET    Component Value Date/Time   NA 142 03/10/2015 1541   K 3.6 03/10/2015 1541   CL 104 03/10/2015 1541   CO2 29  03/10/2015 1541   GLUCOSE 98 03/10/2015 1541   BUN 13 03/10/2015 1541   CREATININE 0.85 03/10/2015 1541   CALCIUM 10.2 03/10/2015 1541   GFRNONAA 76* 02/03/2011 0405   GFRAA 88* 02/03/2011 0405    Lipid Panel  No results found for: CHOL, TRIG, HDL, CHOLHDL, VLDL, LDLCALC  CBC    Component Value Date/Time   WBC 7.7 02/02/2011 1024   RBC 4.72 02/02/2011 1024   HGB 12.9 02/02/2011 1024   HCT 39.2 02/02/2011 1024   PLT 361 02/02/2011 1024   MCV 83.1 02/02/2011 1024   MCH 27.3 02/02/2011 1024   MCHC 32.9 02/02/2011 1024   RDW 14.1 02/02/2011 1024   LYMPHSABS 1.5 02/02/2011 1024   MONOABS 0.6 02/02/2011 1024   EOSABS 0.2 02/02/2011 1024   BASOSABS 0.0 02/02/2011 1024    Hgb A1C No results found for: HGBA1C       Assessment & Plan:   Abscess with cellulitis, left upper  back:  Abscess was drained and packed Area was covered with antibiotic ointment Aftercare instructions given eRx for Septra BID x 10 days Return precautions given  RTC as needed or if symptoms persist or worsen

## 2015-08-03 NOTE — Progress Notes (Signed)
Pre visit review using our clinic review tool, if applicable. No additional management support is needed unless otherwise documented below in the visit note. 

## 2015-08-03 NOTE — Patient Instructions (Signed)

## 2015-08-26 ENCOUNTER — Ambulatory Visit (INDEPENDENT_AMBULATORY_CARE_PROVIDER_SITE_OTHER): Payer: Medicare Other | Admitting: Internal Medicine

## 2015-08-26 ENCOUNTER — Ambulatory Visit: Payer: Medicare Other | Admitting: Internal Medicine

## 2015-08-26 ENCOUNTER — Encounter: Payer: Self-pay | Admitting: Internal Medicine

## 2015-08-26 VITALS — BP 140/92 | HR 82 | Temp 98.2°F | Wt 200.0 lb

## 2015-08-26 DIAGNOSIS — G44209 Tension-type headache, unspecified, not intractable: Secondary | ICD-10-CM

## 2015-08-26 DIAGNOSIS — L02212 Cutaneous abscess of back [any part, except buttock]: Secondary | ICD-10-CM

## 2015-08-26 MED ORDER — NAPROXEN 375 MG PO TABS: 375.0000 mg | ORAL_TABLET | Freq: Two times a day (BID) | ORAL | Status: DC | PRN

## 2015-08-26 MED ORDER — SULFAMETHOXAZOLE-TRIMETHOPRIM 800-160 MG PO TABS: 1.0000 | ORAL_TABLET | Freq: Two times a day (BID) | ORAL | Status: DC

## 2015-08-26 NOTE — Progress Notes (Signed)
Pre visit review using our clinic review tool, if applicable. No additional management support is needed unless otherwise documented below in the visit note. 

## 2015-08-26 NOTE — Patient Instructions (Signed)
Tension Headache A tension headache is a feeling of pain, pressure, or aching that is often felt over the front and sides of the head. The pain can be dull, or it can feel tight (constricting). Tension headaches are not normally associated with nausea or vomiting, and they do not get worse with physical activity. Tension headaches can last from 30 minutes to several days. This is the most common type of headache. CAUSES The exact cause of this condition is not known. Tension headaches often begin after stress, anxiety, or depression. Other triggers may include:  Alcohol.  Too much caffeine, or caffeine withdrawal.  Respiratory infections, such as colds, flu, or sinus infections.  Dental problems or teeth clenching.  Fatigue.  Holding your head and neck in the same position for a long period of time, such as while using a computer.  Smoking. SYMPTOMS Symptoms of this condition include:  A feeling of pressure around the head.  Dull, aching head pain.  Pain felt over the front and sides of the head.  Tenderness in the muscles of the head, neck, and shoulders. DIAGNOSIS This condition may be diagnosed based on your symptoms and a physical exam. Tests may be done, such as a CT scan or an MRI of your head. These tests may be done if your symptoms are severe or unusual. TREATMENT This condition may be treated with lifestyle changes and medicines to help relieve symptoms. HOME CARE INSTRUCTIONS Managing Pain  Take over-the-counter and prescription medicines only as told by your health care provider.  Lie down in a dark, quiet room when you have a headache.  If directed, apply ice to the head and neck area:  Put ice in a plastic bag.  Place a towel between your skin and the bag.  Leave the ice on for 20 minutes, 2-3 times per day.  Use a heating pad or a hot shower to apply heat to the head and neck area as told by your health care provider. Eating and Drinking  Eat meals on  a regular schedule.  Limit alcohol use.  Decrease your caffeine intake, or stop using caffeine. General Instructions  Keep all follow-up visits as told by your health care provider. This is important.  Keep a headache journal to help find out what may trigger your headaches. For example, write down:  What you eat and drink.  How much sleep you get.  Any change to your diet or medicines.  Try massage or other relaxation techniques.  Limit stress.  Sit up straight, and avoid tensing your muscles.  Do not use tobacco products, including cigarettes, chewing tobacco, or e-cigarettes. If you need help quitting, ask your health care provider.  Exercise regularly as told by your health care provider.  Get 7-9 hours of sleep, or the amount recommended by your health care provider. SEEK MEDICAL CARE IF:  Your symptoms are not helped by medicine.  You have a headache that is different from what you normally experience.  You have nausea or you vomit.  You have a fever. SEEK IMMEDIATE MEDICAL CARE IF:  Your headache becomes severe.  You have repeated vomiting.  You have a stiff neck.  You have a loss of vision.  You have problems with speech.  You have pain in your eye or ear.  You have muscular weakness or loss of muscle control.  You lose your balance or you have trouble walking.  You feel faint or you pass out.  You have confusion.     This information is not intended to replace advice given to you by your health care provider. Make sure you discuss any questions you have with your health care provider.   Document Released: 03/20/2005 Document Revised: 12/09/2014 Document Reviewed: 07/13/2014 Elsevier Interactive Patient Education 2016 [employer]  

## 2015-08-26 NOTE — Progress Notes (Signed)
Subjective:    Patient ID: Teresa Fry, female    DOB: [birth year], 70 y.o.   MRN: [id number]  HPI  Pt presents to the clinic today with c/o headache and dizziness. This started 1 week ago. The headache is located on the left, posterior back of the head. She describes the pain as achy. She describes the dizziness as a sense of imbalance, not that the room is spinning. She has had some associated nausea. She has taken Aleve and BC with some relief. It is not related to position changes or head movement. She denies pain in her neck or sinus symptoms. She does have a history of HTN, managed on Lisinopril and Amlodipine. She denies increase in stress.  She also wants me to check the abscess that she had I&D on her back 08/03/15. It is still hard and a a little tender. She has not noticed any further drainage from the area. She has finished all of her antibiotics.  Review of Systems      Past Medical History  Diagnosis Date  . Hypertension   . Gallstones     Current Outpatient Prescriptions  Medication Sig Dispense Refill  . amLODipine (NORVASC) 10 MG tablet Take 1 tablet (10 mg total) by mouth daily. 30 tablet 5  . aspirin 81 MG tablet Take 81 mg by mouth daily.      Marland Kitchen lisinopril (PRINIVIL,ZESTRIL) 40 MG tablet Take 1 tablet (40 mg total) by mouth daily. 30 tablet 5  . methocarbamol (ROBAXIN) 500 MG tablet Take 1 tablet (500 mg total) by mouth every 8 (eight) hours as needed for muscle spasms. 30 tablet 0  . Multiple Vitamin (MULTIVITAMIN) capsule Take 1 capsule by mouth daily.      . niacin 500 MG tablet Take 1,000 mg by mouth daily.    Marland Kitchen sulfamethoxazole-trimethoprim (BACTRIM DS,SEPTRA DS) 800-160 MG tablet Take 1 tablet by mouth 2 (two) times daily. 20 tablet 0   No current facility-administered medications for this visit.    No Known Allergies  Family History  Problem Relation Age of Onset  . Cancer Daughter     breast  . Heart disease Father     Social History   Social  History  . Marital Status: Married    Spouse Name: N/A  . Number of Children: N/A  . Years of Education: N/A   Occupational History  . Not on file.   Social History Main Topics  . Smoking status: Never Smoker   . Smokeless tobacco: Never Used  . Alcohol Use: No  . Drug Use: No  . Sexual Activity: No   Other Topics Concern  . Not on file   Social History Narrative     Constitutional: Pt reports headache. Denies fever, malaise, fatigue, or abrupt weight changes.  HEENT: Denies eye pain, eye redness, ear pain, ringing in the ears, wax buildup, runny nose, nasal congestion, bloody nose, or sore throat. Respiratory: Denies difficulty breathing, shortness of breath, cough or sputum production.   Cardiovascular: Denies chest pain, chest tightness, palpitations or swelling in the hands or feet.  Musculoskeletal: Denies decrease in range of motion, difficulty with gait, muscle pain or joint pain and swelling.  Skin: Pt reports healing abscess to left upper back. Denies rashes, lesions or ulcercations.  Neurological: Pt reports dizziness. Denies difficulty with memory, difficulty with speech or problems with balance and coordination.  Psych: Denies anxiety, depression, SI/HI.  No other specific complaints in a complete review of systems (except as  listed in HPI above).  Objective:   Physical Exam   BP 140/92 mmHg  Pulse 82  Temp(Src) 98.2 F (36.8 C) (Oral)  Wt 200 lb (90.719 kg)  SpO2 98% Wt Readings from Last 3 Encounters:  08/26/15 200 lb (90.719 kg)  08/03/15 200 lb 8 oz (90.946 kg)  03/10/15 210 lb (95.255 kg)    General: Appears her stated age, obese in NAD. Skin: Warm, dry and intact. Healed abscess to left upper back, remains hard, slightly red and tender. No drainage noted. HEENT: Head: normal shape and size; Eyes: sclera white, no icterus, conjunctiva pink, PERRLA and EOMs intact; Ears: Tm's gray and intact, normal light reflex;  Cardiovascular: Normal rate and  rhythm. S1,S2 noted.  No murmur, rubs or gallops noted. d. Pulmonary/Chest: Normal effort and positive vesicular breath sounds. No respiratory distress. No wheezes, rales or ronchi noted.  Musculoskeletal: Normal flexion, extension and rotation of the cervical spine. No bony tenderness noted over the cervical spine. No tenderness noted of the paracervical muscles. Neurological: Alert and oriented.  Coordination normal.    BMET    Component Value Date/Time   NA 142 03/10/2015 1541   K 3.6 03/10/2015 1541   CL 104 03/10/2015 1541   CO2 29 03/10/2015 1541   GLUCOSE 98 03/10/2015 1541   BUN 13 03/10/2015 1541   CREATININE 0.85 03/10/2015 1541   CALCIUM 10.2 03/10/2015 1541   GFRNONAA 76* 02/03/2011 0405   GFRAA 88* 02/03/2011 0405    Lipid Panel  No results found for: CHOL, TRIG, HDL, CHOLHDL, VLDL, LDLCALC  CBC    Component Value Date/Time   WBC 7.7 02/02/2011 1024   RBC 4.72 02/02/2011 1024   HGB 12.9 02/02/2011 1024   HCT 39.2 02/02/2011 1024   PLT 361 02/02/2011 1024   MCV 83.1 02/02/2011 1024   MCH 27.3 02/02/2011 1024   MCHC 32.9 02/02/2011 1024   RDW 14.1 02/02/2011 1024   LYMPHSABS 1.5 02/02/2011 1024   MONOABS 0.6 02/02/2011 1024   EOSABS 0.2 02/02/2011 1024   BASOSABS 0.0 02/02/2011 1024    Hgb A1C No results found for: HGBA1C      Assessment & Plan:   Abscess of back:  Still slight hard and erythematous Repeat Septra BID x 10 days  Tension headache:  Advised her to try a heating pad, massage and neck stretches eRx for Naproxen 375 mg BID prn  RTC as needed or if symptoms persist or worsen

## 2015-09-06 ENCOUNTER — Ambulatory Visit: Payer: Medicare Other | Admitting: Internal Medicine

## 2015-09-06 DIAGNOSIS — Z0289 Encounter for other administrative examinations: Secondary | ICD-10-CM

## 2015-09-06 DIAGNOSIS — R591 Generalized enlarged lymph nodes: Secondary | ICD-10-CM | POA: Diagnosis not present

## 2015-09-07 ENCOUNTER — Ambulatory Visit (INDEPENDENT_AMBULATORY_CARE_PROVIDER_SITE_OTHER)
Admission: RE | Admit: 2015-09-07 | Discharge: 2015-09-07 | Disposition: A | Discharge status: Home / Self Care | Payer: Medicare Other | Source: Ambulatory Visit | Attending: Internal Medicine | Admitting: Internal Medicine

## 2015-09-07 ENCOUNTER — Encounter: Payer: Self-pay | Admitting: Internal Medicine

## 2015-09-07 ENCOUNTER — Ambulatory Visit (INDEPENDENT_AMBULATORY_CARE_PROVIDER_SITE_OTHER): Payer: Medicare Other | Admitting: Internal Medicine

## 2015-09-07 VITALS — BP 136/80 | HR 94 | Temp 98.1°F | Wt 197.0 lb

## 2015-09-07 DIAGNOSIS — R591 Generalized enlarged lymph nodes: Secondary | ICD-10-CM

## 2015-09-07 DIAGNOSIS — R599 Enlarged lymph nodes, unspecified: Secondary | ICD-10-CM

## 2015-09-07 DIAGNOSIS — R51 Headache: Secondary | ICD-10-CM

## 2015-09-07 DIAGNOSIS — R519 Headache, unspecified: Secondary | ICD-10-CM

## 2015-09-07 DIAGNOSIS — M50223 Other cervical disc displacement at C6-C7 level: Secondary | ICD-10-CM | POA: Diagnosis not present

## 2015-09-07 DIAGNOSIS — M50222 Other cervical disc displacement at C5-C6 level: Secondary | ICD-10-CM | POA: Diagnosis not present

## 2015-09-07 DIAGNOSIS — R5381 Other malaise: Secondary | ICD-10-CM

## 2015-09-07 NOTE — Progress Notes (Signed)
Pre visit review using our clinic review tool, if applicable. No additional management support is needed unless otherwise documented below in the visit note. 

## 2015-09-08 ENCOUNTER — Encounter: Payer: Self-pay | Admitting: Internal Medicine

## 2015-09-08 LAB — COMPREHENSIVE METABOLIC PANEL
ALBUMIN: 3.6 g/dL (ref 3.5–5.2)
ALK PHOS: 90 U/L (ref 39–117)
ALT: 27 U/L (ref 0–35)
AST: 32 U/L (ref 0–37)
BILIRUBIN TOTAL: 0.5 mg/dL (ref 0.2–1.2)
BUN: 9 mg/dL (ref 6–23)
CALCIUM: 9.2 mg/dL (ref 8.4–10.5)
CO2: 27 mEq/L (ref 19–32)
Chloride: 100 mEq/L (ref 96–112)
Creatinine, Ser: 0.91 mg/dL (ref 0.40–1.20)
GFR: 78.96 mL/min (ref >60.00)
Glucose, Bld: 90 mg/dL (ref 70–99)
Potassium: 4.1 mEq/L (ref 3.5–5.1)
Sodium: 137 mEq/L (ref 135–145)
TOTAL PROTEIN: 8.1 g/dL (ref 6.0–8.3)

## 2015-09-08 LAB — CBC
HCT: 36.8 % (ref 36.0–46.0)
HEMOGLOBIN: 12.1 g/dL (ref 12.0–15.0)
MCHC: 33 g/dL (ref 30.0–36.0)
MCV: 84.1 fl (ref 78.0–100.0)
PLATELETS: 300 10*3/uL (ref 150.0–400.0)
RBC: 4.38 Mil/uL (ref 3.87–5.11)
RDW: 13.9 % (ref 11.5–15.5)
WBC: 16.8 10*3/uL — AB (ref 4.0–10.5)

## 2015-09-08 LAB — TSH: TSH: 2.23 u[IU]/mL (ref 0.35–4.50)

## 2015-09-08 MED ORDER — AMITRIPTYLINE HCL 25 MG PO TABS: 25.0000 mg | ORAL_TABLET | Freq: Every day | ORAL | Status: DC

## 2015-09-08 NOTE — Addendum Note (Signed)
Addended by: Lurlean Nanny on: 09/08/2015 12:33 PM   Modules accepted: Orders

## 2015-09-08 NOTE — Patient Instructions (Signed)

## 2015-09-08 NOTE — Progress Notes (Signed)
Subjective:    Patient ID: Teresa Fry, female    DOB: 12/16/1945, 70 y.o.   MRN: [id number]  HPI  Pt presents to the clinic today with c/o "not feeling well". She has these ongoing headaches. They are located in the back of her head. She describes the pain as tight and achy. She denies blurred vision but does have intermittent dizziness. She denies runny nose, nasal congestion, ear pain or sore throat. She does have a slight cough productive of green mucous. She denies fever, chills or shortness of breath. She denies nausea, vomiting or diarrhea. She has had headaches in the past and reports these are similar. She reports she did see a neurologist in the past. She has taken Aleve without any relief. She feels like the Naxproxen made it worse. She reports she only ever gets relief from [facility] powders.  She also reports swollen lymph nodes to the back of her neck. She noticed this a few days ago. They have gone down in size. She did go to [facility] for the same, they prescribed her Amoxicillin but she reports she never picked it up. She was on 2 courses of Septra for a abscess of her left upper back, 1 month ago.   Review of Systems   Past Medical History  Diagnosis Date  . Hypertension   . Gallstones     Current Outpatient Prescriptions  Medication Sig Dispense Refill  . amLODipine (NORVASC) 10 MG tablet Take 1 tablet (10 mg total) by mouth daily. 30 tablet 5  . aspirin 81 MG tablet Take 81 mg by mouth daily.      Marland Kitchen lisinopril (PRINIVIL,ZESTRIL) 40 MG tablet Take 1 tablet (40 mg total) by mouth daily. 30 tablet 5  . Multiple Vitamin (MULTIVITAMIN) capsule Take 1 capsule by mouth daily.      . niacin 500 MG tablet Take 1,000 mg by mouth daily.     No current facility-administered medications for this visit.    No Known Allergies  Family History  Problem Relation Age of Onset  . Cancer Daughter     breast  . Heart disease Father     Social History   Social History  . Marital Status:  Married    Spouse Name: N/A  . Number of Children: N/A  . Years of Education: N/A   Occupational History  . Not on file.   Social History Main Topics  . Smoking status: Never Smoker   . Smokeless tobacco: Never Used  . Alcohol Use: No  . Drug Use: No  . Sexual Activity: No   Other Topics Concern  . Not on file   Social History Narrative     Constitutional: Pt reports malaise and headache. Denies fever, malaise, or abrupt weight changes.  HEENT: Denies eye pain, eye redness, ear pain, ringing in the ears, wax buildup, runny nose, nasal congestion, bloody nose, or sore throat. Respiratory: Pt reports cough. Denies difficulty breathing, shortness of breath, or sputum production.   Cardiovascular: Denies chest pain, chest tightness, palpitations or swelling in the hands or feet.  Gastrointestinal: Denies abdominal pain, bloating, constipation, diarrhea or blood in the stool.  GU: Denies urgency, frequency, pain with urination, burning sensation, blood in urine, odor or discharge. Neurological: Pt reports intermittent dizziness. Denies dizziness, difficulty with memory, difficulty with speech or problems with balance and coordination.    No other specific complaints in a complete review of systems (except as listed in HPI above).   Objective:  Physical Exam   BP 136/80 mmHg  Pulse 94  Temp(Src) 98.1 F (36.7 C) (Oral)  Wt 197 lb (89.359 kg)  SpO2 97% Wt Readings from Last 3 Encounters:  09/07/15 197 lb (89.359 kg)  08/26/15 200 lb (90.719 kg)  08/03/15 200 lb 8 oz (90.946 kg)    General: Appears her stated age,  in NAD. Skin: Warm, dry and intact. > 1 cm hard lympe node noted, posterior cervical. HEENT: Head: normal shape and size; Eyes: sclera white, no icterus, conjunctiva pink, PERRLA and EOMs intact; Ears: Tm's gray and intact, normal light reflex;  Neck:  Neck supple, trachea midline. No masses, lumps or thyromegaly present.  Cardiovascular: Normal rate and  rhythm. S1,S2 noted.  No murmur, rubs or gallops noted.  Pulmonary/Chest: Normal effort and positive vesicular breath sounds. No respiratory distress. No wheezes, rales or ronchi noted.  Neurological: Alert and oriented.  BMET    Component Value Date/Time   NA 142 03/10/2015 1541   K 3.6 03/10/2015 1541   CL 104 03/10/2015 1541   CO2 29 03/10/2015 1541   GLUCOSE 98 03/10/2015 1541   BUN 13 03/10/2015 1541   CREATININE 0.85 03/10/2015 1541   CALCIUM 10.2 03/10/2015 1541   GFRNONAA 76* 02/03/2011 0405   GFRAA 88* 02/03/2011 0405    Lipid Panel  No results found for: CHOL, TRIG, HDL, CHOLHDL, VLDL, LDLCALC  CBC    Component Value Date/Time   WBC 7.7 02/02/2011 1024   RBC 4.72 02/02/2011 1024   HGB 12.9 02/02/2011 1024   HCT 39.2 02/02/2011 1024   PLT 361 02/02/2011 1024   MCV 83.1 02/02/2011 1024   MCH 27.3 02/02/2011 1024   MCHC 32.9 02/02/2011 1024   RDW 14.1 02/02/2011 1024   LYMPHSABS 1.5 02/02/2011 1024   MONOABS 0.6 02/02/2011 1024   EOSABS 0.2 02/02/2011 1024   BASOSABS 0.0 02/02/2011 1024    Hgb A1C No results found for: HGBA1C      Assessment & Plan:   Malaise, lymphadenopathy:  Will check CBC, CMET and TSH If persist, will get ultrasound of lymph node  Frequent headaches:  Xray of cervical spine today If normal, consider Amitriptyline at bedtime  Will follow up after labs an xray

## 2015-10-29 ENCOUNTER — Other Ambulatory Visit: Payer: Self-pay | Admitting: Internal Medicine

## 2015-10-29 DIAGNOSIS — I10 Essential (primary) hypertension: Secondary | ICD-10-CM

## 2015-12-01 ENCOUNTER — Encounter (HOSPITAL_COMMUNITY): Payer: Self-pay | Admitting: Emergency Medicine

## 2015-12-01 DIAGNOSIS — Y929 Unspecified place or not applicable: Secondary | ICD-10-CM | POA: Diagnosis not present

## 2015-12-01 DIAGNOSIS — Z79899 Other long term (current) drug therapy: Secondary | ICD-10-CM | POA: Diagnosis not present

## 2015-12-01 DIAGNOSIS — X101XXA Contact with hot food, initial encounter: Secondary | ICD-10-CM | POA: Diagnosis not present

## 2015-12-01 DIAGNOSIS — Y999 Unspecified external cause status: Secondary | ICD-10-CM | POA: Diagnosis not present

## 2015-12-01 DIAGNOSIS — T310 Burns involving less than 10% of body surface: Secondary | ICD-10-CM | POA: Diagnosis not present

## 2015-12-01 DIAGNOSIS — Z7982 Long term (current) use of aspirin: Secondary | ICD-10-CM | POA: Insufficient documentation

## 2015-12-01 DIAGNOSIS — T2027XA Burn of second degree of neck, initial encounter: Secondary | ICD-10-CM | POA: Diagnosis not present

## 2015-12-01 DIAGNOSIS — T2023XA Burn of second degree of chin, initial encounter: Secondary | ICD-10-CM | POA: Insufficient documentation

## 2015-12-01 DIAGNOSIS — T2022XA Burn of second degree of lip(s), initial encounter: Secondary | ICD-10-CM | POA: Insufficient documentation

## 2015-12-01 DIAGNOSIS — T2003XA Burn of unspecified degree of chin, initial encounter: Secondary | ICD-10-CM | POA: Diagnosis present

## 2015-12-01 DIAGNOSIS — Y939 Activity, unspecified: Secondary | ICD-10-CM | POA: Insufficient documentation

## 2015-12-01 DIAGNOSIS — I10 Essential (primary) hypertension: Secondary | ICD-10-CM | POA: Diagnosis not present

## 2015-12-01 DIAGNOSIS — T2121XA Burn of second degree of chest wall, initial encounter: Secondary | ICD-10-CM | POA: Diagnosis not present

## 2015-12-01 DIAGNOSIS — T2020XA Burn of second degree of head, face, and neck, unspecified site, initial encounter: Secondary | ICD-10-CM | POA: Diagnosis not present
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
## 2015-12-02 ENCOUNTER — Emergency Department (HOSPITAL_COMMUNITY)
Admission: EM | Admit: 2015-12-02 | Discharge: 2015-12-02 | Disposition: A | Discharge status: Other Acute Inpatient Hospital | Payer: Medicare Other | Attending: Emergency Medicine | Admitting: Emergency Medicine

## 2015-12-02 DIAGNOSIS — T310 Burns involving less than 10% of body surface: Secondary | ICD-10-CM | POA: Diagnosis not present

## 2015-12-02 DIAGNOSIS — Z79899 Other long term (current) drug therapy: Secondary | ICD-10-CM | POA: Diagnosis not present

## 2015-12-02 DIAGNOSIS — IMO0002 Reserved for concepts with insufficient information to code with codable children: Secondary | ICD-10-CM

## 2015-12-02 DIAGNOSIS — T2101XA Burn of unspecified degree of chest wall, initial encounter: Secondary | ICD-10-CM | POA: Diagnosis not present

## 2015-12-02 DIAGNOSIS — X101XXA Contact with hot food, initial encounter: Secondary | ICD-10-CM | POA: Diagnosis not present

## 2015-12-02 DIAGNOSIS — T2003XA Burn of unspecified degree of chin, initial encounter: Secondary | ICD-10-CM | POA: Diagnosis present

## 2015-12-02 DIAGNOSIS — T2027XA Burn of second degree of neck, initial encounter: Secondary | ICD-10-CM | POA: Diagnosis not present

## 2015-12-02 DIAGNOSIS — T2023XA Burn of second degree of chin, initial encounter: Secondary | ICD-10-CM | POA: Diagnosis not present

## 2015-12-02 DIAGNOSIS — T2022XA Burn of second degree of lip(s), initial encounter: Secondary | ICD-10-CM | POA: Diagnosis not present

## 2015-12-02 DIAGNOSIS — Y999 Unspecified external cause status: Secondary | ICD-10-CM | POA: Diagnosis not present

## 2015-12-02 DIAGNOSIS — I10 Essential (primary) hypertension: Secondary | ICD-10-CM | POA: Diagnosis not present

## 2015-12-02 DIAGNOSIS — Y929 Unspecified place or not applicable: Secondary | ICD-10-CM | POA: Diagnosis not present

## 2015-12-02 DIAGNOSIS — Y939 Activity, unspecified: Secondary | ICD-10-CM | POA: Diagnosis not present

## 2015-12-02 DIAGNOSIS — Z7982 Long term (current) use of aspirin: Secondary | ICD-10-CM | POA: Diagnosis not present

## 2015-12-02 DIAGNOSIS — T2121XA Burn of second degree of chest wall, initial encounter: Secondary | ICD-10-CM | POA: Diagnosis not present

## 2015-12-02 LAB — CBC WITH DIFFERENTIAL/PLATELET
BASOS ABS: 0 10*3/uL (ref 0.0–0.1)
Basophils Relative: 0 %
EOS PCT: 2 %
Eosinophils Absolute: 0.1 10*3/uL (ref 0.0–0.7)
HEMATOCRIT: 42.2 % (ref 36.0–46.0)
HEMOGLOBIN: 13.5 g/dL (ref 12.0–15.0)
LYMPHS ABS: 1.9 10*3/uL (ref 0.7–4.0)
LYMPHS PCT: 23 %
MCH: 27.6 pg (ref 26.0–34.0)
MCHC: 32 g/dL (ref 30.0–36.0)
MCV: 86.3 fL (ref 78.0–100.0)
Monocytes Absolute: 0.8 10*3/uL (ref 0.1–1.0)
Monocytes Relative: 9 %
NEUTROS ABS: 5.5 10*3/uL (ref 1.7–7.7)
NEUTROS PCT: 66 %
PLATELETS: 260 10*3/uL (ref 150–400)
RBC: 4.89 MIL/uL (ref 3.87–5.11)
RDW: 15 % (ref 11.5–15.5)
WBC: 8.4 10*3/uL (ref 4.0–10.5)

## 2015-12-02 LAB — BASIC METABOLIC PANEL
ANION GAP: 6 (ref 5–15)
BUN: 10 mg/dL (ref 6–20)
CHLORIDE: 104 mmol/L (ref 101–111)
CO2: 29 mmol/L (ref 22–32)
Calcium: 9.5 mg/dL (ref 8.9–10.3)
Creatinine, Ser: 0.82 mg/dL (ref 0.44–1.00)
GFR calc Af Amer: >60 mL/min (ref >60)
GLUCOSE: 102 mg/dL — AB (ref 65–99)
POTASSIUM: 3.3 mmol/L — AB (ref 3.5–5.1)
Sodium: 139 mmol/L (ref 135–145)

## 2015-12-02 MED ORDER — MORPHINE SULFATE (PF) 4 MG/ML IV SOLN
4.0000 mg | Freq: Once | INTRAVENOUS | Status: AC
  Administered 2015-12-02: 4 mg via INTRAVENOUS
  Filled 2015-12-02: qty 1

## 2015-12-02 MED ORDER — SODIUM CHLORIDE 0.9 % IV BOLUS (SEPSIS)
500.0000 mL | Freq: Once | INTRAVENOUS | Status: AC
  Administered 2015-12-02: 500 mL via INTRAVENOUS

## 2015-12-02 MED ORDER — ONDANSETRON HCL 4 MG/2ML IJ SOLN
4.0000 mg | Freq: Once | INTRAMUSCULAR | Status: AC
  Administered 2015-12-02: 4 mg via INTRAVENOUS
  Filled 2015-12-02: qty 2
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
## 2015-12-07 ENCOUNTER — Encounter: Payer: Self-pay | Admitting: Internal Medicine

## 2015-12-07 ENCOUNTER — Ambulatory Visit (INDEPENDENT_AMBULATORY_CARE_PROVIDER_SITE_OTHER): Payer: Medicare Other | Admitting: Internal Medicine

## 2015-12-07 VITALS — BP 142/90 | HR 94 | Temp 99.0°F | Wt 201.5 lb

## 2015-12-07 DIAGNOSIS — T2121XD Burn of second degree of chest wall, subsequent encounter: Secondary | ICD-10-CM | POA: Diagnosis not present

## 2015-12-07 DIAGNOSIS — T2020XD Burn of second degree of head, face, and neck, unspecified site, subsequent encounter: Secondary | ICD-10-CM

## 2015-12-07 DIAGNOSIS — T2027XD Burn of second degree of neck, subsequent encounter: Secondary | ICD-10-CM
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
## 2015-12-07 NOTE — Patient Instructions (Signed)
Burn Care °Your skin is a natural barrier to infection. It is the largest organ of your body. Burns damage this natural protection. To help prevent infection, it is very important to follow your caregiver's instructions in the care of your burn. °Burns are classified as: °· First degree. There is only redness of the skin (erythema). No scarring is expected. °· Second degree. There is blistering of the skin. Scarring may occur with deeper burns. °· Third degree. All layers of the skin are injured, and scarring is expected. °HOME CARE INSTRUCTIONS  °· Wash your hands well before changing your bandage. °· Change your bandage as often as directed by your caregiver. °¨ Remove the old bandage. If the bandage sticks, you may soak it off with cool, clean water. °¨ Cleanse the burn thoroughly but gently with mild soap and water. °¨ Pat the area dry with a clean, dry cloth. °¨ Apply a thin layer of antibacterial cream to the burn. °¨ Apply a clean bandage as instructed by your caregiver. °¨ Keep the bandage as clean and dry as possible. °· Elevate the affected area for the first 24 hours, then as instructed by your caregiver. °· Only take over-the-counter or prescription medicines for pain, discomfort, or fever as directed by your caregiver. °SEEK IMMEDIATE MEDICAL CARE IF:  °· You develop excessive pain. °· You develop redness, tenderness, swelling, or red streaks near the burn. °· The burned area develops yellowish-white fluid (pus) or a bad smell. °· You have a fever. °MAKE SURE YOU:  °· Understand these instructions. °· Will watch your condition. °· Will get help right away if you are not doing well or get worse. °  °This information is not intended to replace advice given to you by your health care provider. Make sure you discuss any questions you have with your health care provider. °  °Document Released: 03/20/2005 Document Revised: 06/12/2011 Document Reviewed: 08/10/2010 °Elsevier Interactive Patient Education ©2016  [employer] Inc. ° °

## 2015-12-13 DIAGNOSIS — L299 Pruritus, unspecified: Secondary | ICD-10-CM | POA: Diagnosis not present

## 2015-12-13 DIAGNOSIS — T2121XA Burn of second degree of chest wall, initial encounter: Secondary | ICD-10-CM | POA: Diagnosis not present

## 2016-01-20 ENCOUNTER — Encounter: Payer: Self-pay | Admitting: Internal Medicine

## 2016-01-20 ENCOUNTER — Ambulatory Visit (INDEPENDENT_AMBULATORY_CARE_PROVIDER_SITE_OTHER): Payer: Medicare Other | Admitting: Internal Medicine

## 2016-01-20 VITALS — BP 140/84 | HR 74 | Temp 98.2°F | Wt 202.5 lb

## 2016-01-20 DIAGNOSIS — M545 Low back pain, unspecified: Secondary | ICD-10-CM

## 2016-01-20 NOTE — Patient Instructions (Signed)

## 2016-01-20 NOTE — Progress Notes (Signed)
Subjective:    Patient ID: Teresa Fry, female    DOB: 11/28/1945, 70 y.o.   MRN: [id number]  HPI  Pt presents to the clinic today with c/o low back pain. She reports this started 3-4 weeks ago. She describes the pain as sore and achy. The pain comes and goes. It is worse with movement. She denies numbness or tingling in her legs. She denies loss of bowel or bladder. She has tried Aleve and [employer] with some relief. She denies any injury to the area.  Review of Systems      Past Medical History:  Diagnosis Date  . Gallstones   . Hypertension     Current Outpatient Prescriptions  Medication Sig Dispense Refill  . amitriptyline (ELAVIL) 25 MG tablet Take 1 tablet (25 mg total) by mouth at bedtime. 30 tablet 1  . amLODipine (NORVASC) 10 MG tablet Take 1 tablet (10 mg total) by mouth daily. MUST SCHEDULE ANNUAL PHYSICAL 30 tablet 2  . aspirin 81 MG tablet Take 81 mg by mouth daily.      Marland Kitchen lisinopril (PRINIVIL,ZESTRIL) 40 MG tablet Take 1 tablet (40 mg total) by mouth daily. MUST SCHEDULE ANNUAL PHYSICAL 30 tablet 2  . Multiple Vitamin (MULTIVITAMIN) capsule Take 1 capsule by mouth daily.      . niacin 500 MG tablet Take 1,000 mg by mouth daily.     No current facility-administered medications for this visit.     No Known Allergies  Family History  Problem Relation Age of Onset  . Heart disease Father   . Cancer Daughter     breast    Social History   Social History  . Marital status: Married    Spouse name: N/A  . Number of children: N/A  . Years of education: N/A   Occupational History  . Not on file.   Social History Main Topics  . Smoking status: Never Smoker  . Smokeless tobacco: Never Used  . Alcohol use No  . Drug use: No  . Sexual activity: No   Other Topics Concern  . Not on file   Social History Narrative  . No narrative on file     Constitutional: Denies fever, malaise, fatigue, headache or abrupt weight changes.  Gastrointestinal:  Denies abdominal pain, bloating, constipation, diarrhea or blood in the stool.  GU: Denies urgency, frequency, pain with urination, burning sensation, blood in urine, odor or discharge. Musculoskeletal: Pt reports back pain. Denies decrease in range of motion, difficulty with gait, or joint pain and swelling.  Skin: Denies redness, rashes, lesions or ulcercations.  Neurological: Denies dizziness, difficulty with memory, difficulty with speech or problems with balance and coordination.    No other specific complaints in a complete review of systems (except as listed in HPI above).  Objective:   Physical Exam   BP 140/84   Pulse 74   Temp 98.2 F (36.8 C) (Oral)   Wt 202 lb 8 oz (91.9 kg)   SpO2 98%   BMI 33.70 kg/m  Wt Readings from Last 3 Encounters:  01/20/16 202 lb 8 oz (91.9 kg)  12/07/15 201 lb 8 oz (91.4 kg)  12/01/15 185 lb (83.9 kg)    General: Appears her stated age, well developed, well nourished in NAD. Musculoskeletal: Normal flexion, extension and rotation of the spine. No bony tenderness noted over the lumbar spine. Pain with palpation across the musculature of the lower back. Strength 5/5 BLE. No difficulty with gait. Neurological: Alert and  oriented.    BMET    Component Value Date/Time   NA 139 12/02/2015 0113   K 3.3 (L) 12/02/2015 0113   CL 104 12/02/2015 0113   CO2 29 12/02/2015 0113   GLUCOSE 102 (H) 12/02/2015 0113   BUN 10 12/02/2015 0113   CREATININE 0.82 12/02/2015 0113   CALCIUM 9.5 12/02/2015 0113   GFRNONAA >60 12/02/2015 0113   GFRAA >60 12/02/2015 0113    Lipid Panel  No results found for: CHOL, TRIG, HDL, CHOLHDL, VLDL, LDLCALC  CBC    Component Value Date/Time   WBC 8.4 12/02/2015 0113   RBC 4.89 12/02/2015 0113   HGB 13.5 12/02/2015 0113   HCT 42.2 12/02/2015 0113   PLT 260 12/02/2015 0113   MCV 86.3 12/02/2015 0113   MCH 27.6 12/02/2015 0113   MCHC 32.0 12/02/2015 0113   RDW 15.0 12/02/2015 0113   LYMPHSABS 1.9 12/02/2015  0113   MONOABS 0.8 12/02/2015 0113   EOSABS 0.1 12/02/2015 0113   BASOSABS 0.0 12/02/2015 0113    Hgb A1C No results found for: HGBA1C       Assessment & Plan:   Low back pain:  Muscular in origin Stretching exercise given A heating pad may be helpful Start taking Aleve every 12 hours if needed  RTC as needed or if symptoms persist or worsen Janeece Blok, NP

## 2016-03-08 ENCOUNTER — Other Ambulatory Visit: Payer: Self-pay | Admitting: Internal Medicine

## 2016-03-08 DIAGNOSIS — I10 Essential (primary) hypertension: Secondary | ICD-10-CM

## 2016-04-11 ENCOUNTER — Telehealth: Payer: Self-pay | Admitting: Internal Medicine
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
## 2016-04-11 NOTE — Telephone Encounter (Signed)
PLEASE NOTE: All timestamps contained within this report are represented as [place] Standard Time. CONFIDENTIALTY NOTICE: This fax transmission is intended only for the addressee. It contains information that is legally privileged, confidential or otherwise protected from use or disclosure. If you are not the intended recipient, you are strictly prohibited from reviewing, disclosing, copying using or disseminating any of this information or taking any action in reliance on or regarding this information. If you have received this fax in error, please notify us immediately by telephone so that we can arrange for its return to [place]. Phone: [phone], Toll-Free: [phone], Fax: [phone] Page: 1 of 2 Call Id: [id number] [place] Patient Name: Teresa Fry Gender: Female DOB: [birth year] Age: 71 Y 33 D Return Phone Number: [phone] (Primary) Address: City/State/Zip: [place] Client [place] [facility] - Client Client Site [place] - Day Physician Webb Silversmith - NP Contact Type Call Who Is Calling Patient / Member / Family / Caregiver Call Type Triage / Clinical Relationship To Patient Self Return Phone Number [phone] (Primary) Chief Complaint Head Injury (non urgent symptom) Reason for Call Symptomatic / Request for [place] states she fell this morning, hit head and shoulder. Denies any change in behavior. Appointment Disposition EMR Appointment Scheduled Info pasted into Epic Yes PreDisposition Call Doctor Translation No Nurse Assessment Nurse: Julien Girt, RN, Almyra Free Date/Time Eilene Ghazi Time): 04/11/2016 8:58:54 AM Confirm and document reason for call. If symptomatic, describe symptoms. ---Caller states she fell this morning going to her car and hit the right side of her head and right shoulder. She does have a  small knot on the side of her head, no swelling of the shoulder " just tender" . No abrasions or bleeding. Does the patient have any new or worsening symptoms? ---Yes Will a triage be completed? ---Yes Related visit to physician within the last 2 weeks? ---No Does the PT have any chronic conditions? (i.e. diabetes, asthma, etc.) ---Yes List chronic conditions. ---Htn Hx back pain Is this a behavioral health or substance abuse call? ---No Guidelines Guideline Title Affirmed Question Affirmed Notes Nurse Date/Time (Eastern Time) Head Injury Scalp swelling, bruise or pain (all triage questions negative) Julien Girt, RN, Almyra Free 04/11/2016 9:02:57 AM Shoulder Injury Minor injury or bruise from direct blow (all triage questions negative) Julien Girt, RN, Almyra Free 04/11/2016 9:05:17 AM PLEASE NOTE: All timestamps contained within this report are represented as [place] Standard Time. CONFIDENTIALTY NOTICE: This fax transmission is intended only for the addressee. It contains information that is legally privileged, confidential or otherwise protected from use or disclosure. If you are not the intended recipient, you are strictly prohibited from reviewing, disclosing, copying using or disseminating any of this information or taking any action in reliance on or regarding this information. If you have received this fax in error, please notify us immediately by telephone so that we can arrange for its return to [place]. Phone: [phone], Toll-Free: [phone], Fax: [phone] Page: 2 of 2 Call Id: [id number] [place]. Time Eilene Ghazi Time) Disposition Final User 04/11/2016 8:54:04 AM Send To Clinical Follow Up Rich Brave, Amy 04/11/2016 9:05:01 AM Home Care Chancy Hurter 04/11/2016 9:11:41 AM Home Care Yes Julien Girt, RN, Dagoberto Reef Understands: Yes Disagree/Comply: Helyn Numbers Understands: Yes Disagree/Comply: Comply Care Advice Given Per Guideline HOME CARE: You should be able to treat this at home. REASSURANCE:  It sounds like a scalp injury rather than a brain injury  or concussion. Treatment at home should be safe. * Apply a cold pack or an ice bag (wrapped in a moist towel) to the area for 20 minutes. Repeat in 1 hour, then every 4 hours while awake. * Continue this for the first 48 hours after an injury (Reason: to reduce the swelling and pain). * The head-injured person should be observed closely during the first 2 hours following the injury. * The head-injured person should be awoken every 4 hours for the first 24 hours; check the ability to walk and talk. * Some mild headache, mild dizziness and nausea are common. * For pain relief, take acetaminophen, ibuprofen, or naproxen. * Most head trauma only causes an injury to the scalp. * Pain and swelling usually begin to improve 2 or 3 days after an injury. * Swelling is usually gone in 7 days. Pain and tenderness at the site may take 1-2 weeks to completely resolve. * Severe headache persists over 2 hours after ice pack and pain medications * Extremity weakness or numbness occurs * Slurred speech or blurred vision occurs * Vomiting occurs * You become worse. CARE ADVICE given per Head Injury (Adult) guideline. HOME CARE: You should be able to treat this at home. REASSURANCE: It sounds like a bruised muscle or bone. We can treat that at home. REST: Rest the shoulder for the next 24 hours. After that, for 5 days avoid any activity that causes significant pain. * Apply a cold pack or an ice bag (wrapped in a moist towel) to the area for 20 minutes. Repeat in 1 hour, then every 4 hours while awake. * Continue this for the first 48 hours after an injury (Reason: to reduce the swelling and pain). * Beginning 48 hours after an injury, apply a warm washcloth or heating pad for 10 minutes three times a day. * This will help increase circulation and improve healing. * For pain relief, take acetaminophen, ibuprofen, or naproxen. * Severe pain persists over 2 hours  after pain medicine and ice * Swelling or bruise becomes over 2 inches (5 cm). * Pain not improved after 3 days (R/O: mild fracture) * Pain or swelling lasts over 2 weeks * You become worse. CARE ADVICE given per Shoulder Injury (Adult) guideline. EXPECTED COURSE: Pain and swelling usually peak on day 2 or 3. Swelling is usually gone by 7 days. Pain may take 2 weeks to completely resolve. Comments User: Eloisa Northern, RN Date/Time Eilene Ghazi Time): 04/11/2016 9:15:19 AM Patient request an appointment for examination. She verbalized understanding of all instructions and will cb as needed if sx worsen.

## 2016-04-11 NOTE — Telephone Encounter (Signed)
Pt has appt 04/12/16 at 2:15 with Avie Echevaria NP.

## 2016-04-12 ENCOUNTER — Encounter: Payer: Self-pay | Admitting: Internal Medicine

## 2016-04-12 ENCOUNTER — Ambulatory Visit (INDEPENDENT_AMBULATORY_CARE_PROVIDER_SITE_OTHER): Payer: Medicare Other | Admitting: Internal Medicine

## 2016-04-12 VITALS — BP 146/88 | HR 82 | Temp 98.6°F | Wt 203.0 lb

## 2016-04-12 DIAGNOSIS — M25511 Pain in right shoulder: Secondary | ICD-10-CM | POA: Diagnosis not present

## 2016-04-12 DIAGNOSIS — M25551 Pain in right hip: Secondary | ICD-10-CM

## 2016-04-12 DIAGNOSIS — M7550 Bursitis of unspecified shoulder: Secondary | ICD-10-CM | POA: Diagnosis not present

## 2016-04-12 DIAGNOSIS — M25561 Pain in right knee: Secondary | ICD-10-CM | POA: Diagnosis not present

## 2016-04-12 DIAGNOSIS — W010XXA Fall on same level from slipping, tripping and stumbling without subsequent striking against object, initial encounter: Secondary | ICD-10-CM

## 2016-04-12 DIAGNOSIS — M7050 Other bursitis of knee, unspecified knee: Secondary | ICD-10-CM

## 2016-04-12 NOTE — Progress Notes (Signed)
Subjective:    Patient ID: Teresa Fry, female    DOB: 11-01-1945, 71 y.o.   MRN: [id number]  HPI  Pt presents to the clinic with c/o fall. This occurred yesterday as she was trying to walk to her car. She slipped and hit the right side of her head and her right knee, hip and shoulder. She does have a small knot on the right side of her head, but denies changes in her mental status. She describes the pain as achy, sore and stiff. The pain worsens with certain movements. She denies numbness or tingling in her arms or leg. She does reports some decreased range of motion, especially in her right shoulder. She has tried Tylenol and Ice with minimal relief.  Review of Systems  Past Medical History:  Diagnosis Date  . Gallstones   . Hypertension     Current Outpatient Prescriptions  Medication Sig Dispense Refill  . amitriptyline (ELAVIL) 25 MG tablet Take 1 tablet (25 mg total) by mouth at bedtime. 30 tablet 1  . amLODipine (NORVASC) 10 MG tablet TAKE ONE TABLET BY MOUTH DAILY **MUST  SCHEDULE  ANNUAL  PHYSICAL** 30 tablet 2  . aspirin 81 MG tablet Take 81 mg by mouth daily.      Marland Kitchen lisinopril (PRINIVIL,ZESTRIL) 40 MG tablet TAKE ONE TABLET BY MOUTH DAILY **MUST  SCHEDULE  ANNUAL  PHYSICAL** 30 tablet 2  . Multiple Vitamin (MULTIVITAMIN) capsule Take 1 capsule by mouth daily.      . niacin 500 MG tablet Take 1,000 mg by mouth daily.     No current facility-administered medications for this visit.     No Known Allergies  Family History  Problem Relation Age of Onset  . Heart disease Father   . Cancer Daughter     breast    Social History   Social History  . Marital status: Married    Spouse name: N/A  . Number of children: N/A  . Years of education: N/A   Occupational History  . Not on file.   Social History Main Topics  . Smoking status: Never Smoker  . Smokeless tobacco: Never Used  . Alcohol use No  . Drug use: No  . Sexual activity: No   Other Topics Concern  .  Not on file   Social History Narrative  . No narrative on file     Constitutional: Denies fever, malaise, fatigue, headache or abrupt weight changes.  Musculoskeletal: Pt reports right shoulder pain, hip and knee pain. Denies difficulty with gait, muscle pain or joint swelling.  Skin: Pt reports knot on the right side of her head. Denies redness, rashes, lesions or ulcercations.  Neurological: Denies dizziness, difficulty with memory, difficulty with speech or problems with balance and coordination.    No other specific complaints in a complete review of systems (except as listed in HPI above).     Objective:   Physical Exam  BP (!) 146/88   Pulse 82   Temp 98.6 F (37 C) (Oral)   Wt 203 lb (92.1 kg)   SpO2 98%   BMI 33.78 kg/m  Wt Readings from Last 3 Encounters:  04/12/16 203 lb (92.1 kg)  01/20/16 202 lb 8 oz (91.9 kg)  12/07/15 201 lb 8 oz (91.4 kg)    General: Appears her stated age, obese in NAD. Skin: Small knot noted on top of right scalp, not open. HEENT: Head: normal shape and size; Eyes: sclera white, no icterus, conjunctiva pink, PERRLA and  EOMs intact;  Musculoskeletal: Decreased internal and external rotation of the right shoulder. Pain with palpation over the right subacromial bursa. Strength 4/5 RUE, 5/5 LUE but negative drop can test on the right. Normal flexion, extension, internal and external rotation of the right hip. No pain with palpation of the hip. Normal flexion and extension of the right knee. Pain with palpation of the right lateral pes bursa. No difficulty with gait. Neurological: Alert and oriented.   BMET    Component Value Date/Time   NA 139 12/02/2015 0113   K 3.3 (L) 12/02/2015 0113   CL 104 12/02/2015 0113   CO2 29 12/02/2015 0113   GLUCOSE 102 (H) 12/02/2015 0113   BUN 10 12/02/2015 0113   CREATININE 0.82 12/02/2015 0113   CALCIUM 9.5 12/02/2015 0113   GFRNONAA >60 12/02/2015 0113   GFRAA >60 12/02/2015 0113    Lipid Panel  No  results found for: CHOL, TRIG, HDL, CHOLHDL, VLDL, LDLCALC  CBC    Component Value Date/Time   WBC 8.4 12/02/2015 0113   RBC 4.89 12/02/2015 0113   HGB 13.5 12/02/2015 0113   HCT 42.2 12/02/2015 0113   PLT 260 12/02/2015 0113   MCV 86.3 12/02/2015 0113   MCH 27.6 12/02/2015 0113   MCHC 32.0 12/02/2015 0113   RDW 15.0 12/02/2015 0113   LYMPHSABS 1.9 12/02/2015 0113   MONOABS 0.8 12/02/2015 0113   EOSABS 0.1 12/02/2015 0113   BASOSABS 0.0 12/02/2015 0113    Hgb A1C No results found for: HGBA1C          Assessment & Plan:   Right shoulder pain secondary to subacromial bursitis, right knee pain secondary to pes bursitis, right hip pain secondary to fall:  No need for xrays at this time Advised her to try Ibuprofen instead of Tylenol Ice would be helpful Work note provided  RTC as needed or if symptoms persist or worsen BAITY, REGINA, NP

## 2016-04-12 NOTE — Patient Instructions (Signed)
Bursitis Introduction Bursitis is when the fluid-filled sac (bursa) that covers and protects a joint is swollen (inflamed). Bursitis is most common near joints, especially the knees, elbows, hips, and shoulders. Follow these instructions at home:  Take medicines only as told by your doctor.  If you were prescribed an antibiotic medicine, finish it all even if you start to feel better.  Rest the affected area as told by your doctor.  Keep the area raised up.  Avoid doing things that make the pain worse.  Apply ice to the injured area:  Place ice in a plastic bag.  Place a towel between your skin and the bag.  Leave the ice on for 20 minutes, 2-3 times a day.  Use splints, braces, pads, or walking aids as told by your doctor.  Keep all follow-up visits as told by your doctor. This is important. Contact a doctor if:  You have more pain with home care.  You have a fever.  You have chills. This information is not intended to replace advice given to you by your health care provider. Make sure you discuss any questions you have with your health care provider. Document Released: 09/07/2009 Document Revised: 08/26/2015 Document Reviewed: 06/09/2013  2017 [employer]  

## 2016-04-20 ENCOUNTER — Ambulatory Visit: Payer: Medicare Other | Admitting: Internal Medicine

## 2016-04-24 ENCOUNTER — Ambulatory Visit (INDEPENDENT_AMBULATORY_CARE_PROVIDER_SITE_OTHER)
Admission: RE | Admit: 2016-04-24 | Discharge: 2016-04-24 | Disposition: A | Discharge status: Home / Self Care | Payer: Medicare Other | Source: Ambulatory Visit | Attending: Internal Medicine | Admitting: Internal Medicine

## 2016-04-24 ENCOUNTER — Ambulatory Visit (INDEPENDENT_AMBULATORY_CARE_PROVIDER_SITE_OTHER): Payer: Medicare Other | Admitting: Internal Medicine

## 2016-04-24 ENCOUNTER — Encounter: Payer: Self-pay | Admitting: Internal Medicine

## 2016-04-24 VITALS — BP 140/82 | HR 80 | Temp 98.4°F | Wt 207.0 lb

## 2016-04-24 DIAGNOSIS — M25511 Pain in right shoulder: Secondary | ICD-10-CM

## 2016-04-24 DIAGNOSIS — W19XXXD Unspecified fall, subsequent encounter: Secondary | ICD-10-CM | POA: Diagnosis not present

## 2016-04-24 DIAGNOSIS — S4991XA Unspecified injury of right shoulder and upper arm, initial encounter: Secondary | ICD-10-CM | POA: Diagnosis not present

## 2016-04-24 NOTE — Progress Notes (Signed)
Subjective:    Patient ID: Teresa Fry, female    DOB: [birth year], 71 y.o.   MRN: [id number]  HPI  Pt presents to the clinic today with c/o ongoing right arm pain s/p a fall that occurred 04/11/2016. She was diagnosed with a subacromial bursitis, advised to take Ibuprofen. She reports she is still having issues lifting her right arm above her head. She has also head minimal improvement in her pain. She has been taking Ibuprofen and ice with minimal relief. She reports she is not able to do her job if she can not use her right arm.  Review of Systems      Past Medical History:  Diagnosis Date  . Gallstones   . Hypertension     Current Outpatient Prescriptions  Medication Sig Dispense Refill  . amitriptyline (ELAVIL) 25 MG tablet Take 1 tablet (25 mg total) by mouth at bedtime. 30 tablet 1  . amLODipine (NORVASC) 10 MG tablet TAKE ONE TABLET BY MOUTH DAILY **MUST  SCHEDULE  ANNUAL  PHYSICAL** 30 tablet 2  . aspirin 81 MG tablet Take 81 mg by mouth daily.      Marland Kitchen lisinopril (PRINIVIL,ZESTRIL) 40 MG tablet TAKE ONE TABLET BY MOUTH DAILY **MUST  SCHEDULE  ANNUAL  PHYSICAL** 30 tablet 2  . Multiple Vitamin (MULTIVITAMIN) capsule Take 1 capsule by mouth daily.      . niacin 500 MG tablet Take 1,000 mg by mouth daily.     No current facility-administered medications for this visit.     No Known Allergies  Family History  Problem Relation Age of Onset  . Heart disease Father   . Cancer Daughter     breast    Social History   Social History  . Marital status: Married    Spouse name: N/A  . Number of children: N/A  . Years of education: N/A   Occupational History  . Not on file.   Social History Main Topics  . Smoking status: Never Smoker  . Smokeless tobacco: Never Used  . Alcohol use No  . Drug use: No  . Sexual activity: No   Other Topics Concern  . Not on file   Social History Narrative  . No narrative on file     Constitutional: Denies fever, malaise, fatigue,  headache or abrupt weight changes.  Musculoskeletal: Pt reports right shoulder pain. Denies decrease in range of motion, difficulty with gait, muscle pain or joint swelling.   No other specific complaints in a complete review of systems (except as listed in HPI above).  Objective:   Physical Exam  BP 140/82   Pulse 80   Temp 98.4 F (36.9 C) (Oral)   Wt 207 lb (93.9 kg)   SpO2 97%   BMI 34.45 kg/m  Wt Readings from Last 3 Encounters:  04/24/16 207 lb (93.9 kg)  04/12/16 203 lb (92.1 kg)  01/20/16 202 lb 8 oz (91.9 kg)    General: Appears her stated age, well developed, well nourished in NAD. Musculoskeletal: Pain with external rotation of the shoulder. Normal internal rotation. Pain over the right AC joint, subacromial bursa and over the scapula. Negative drop can test. Strength 5/5 BUE.    BMET    Component Value Date/Time   NA 139 12/02/2015 0113   K 3.3 (L) 12/02/2015 0113   CL 104 12/02/2015 0113   CO2 29 12/02/2015 0113   GLUCOSE 102 (H) 12/02/2015 0113   BUN 10 12/02/2015 0113   CREATININE 0.82 12/02/2015  0113   CALCIUM 9.5 12/02/2015 0113   GFRNONAA >60 12/02/2015 0113   GFRAA >60 12/02/2015 0113    Lipid Panel  No results found for: CHOL, TRIG, HDL, CHOLHDL, VLDL, LDLCALC  CBC    Component Value Date/Time   WBC 8.4 12/02/2015 0113   RBC 4.89 12/02/2015 0113   HGB 13.5 12/02/2015 0113   HCT 42.2 12/02/2015 0113   PLT 260 12/02/2015 0113   MCV 86.3 12/02/2015 0113   MCH 27.6 12/02/2015 0113   MCHC 32.0 12/02/2015 0113   RDW 15.0 12/02/2015 0113   LYMPHSABS 1.9 12/02/2015 0113   MONOABS 0.8 12/02/2015 0113   EOSABS 0.1 12/02/2015 0113   BASOSABS 0.0 12/02/2015 0113    Hgb A1C No results found for: HGBA1C          Assessment & Plan:   Right shoulder pain:  Xray right shoulder today Continue Ibuprofen and ice May need PT  Will follow up after xray, return precautions discussed Webb Silversmith, NP

## 2016-04-24 NOTE — Patient Instructions (Signed)
Shoulder Exercises Ask your health care provider which exercises are safe for you. Do exercises exactly as told by your health care provider and adjust them as directed. It is normal to feel mild stretching, pulling, tightness, or discomfort as you do these exercises, but you should stop right away if you feel sudden pain or your pain gets worse.Do not begin these exercises until told by your health care provider. RANGE OF MOTION EXERCISES  These exercises warm up your muscles and joints and improve the movement and flexibility of your shoulder. These exercises also help to relieve pain, numbness, and tingling. These exercises involve stretching your injured shoulder directly. Exercise A: Pendulum   1. Stand near a wall or a surface that you can hold onto for balance. 2. Bend at the waist and let your left / right arm hang straight down. Use your other arm to support you. Keep your back straight and do not lock your knees. 3. Relax your left / right arm and shoulder muscles, and move your hips and your trunk so your left / right arm swings freely. Your arm should swing because of the motion of your body, not because you are using your arm or shoulder muscles. 4. Keep moving your body so your arm swings in the following directions, as told by your health care provider:  Side to side.  Forward and backward.  In clockwise and counterclockwise circles. 5. Continue each motion for __________ seconds, or for as long as told by your health care provider. 6. Slowly return to the starting position. Repeat __________ times. Complete this exercise __________ times a day. Exercise B:Flexion, Standing   1. Stand and hold a broomstick, a cane, or a similar object. Place your hands a little more than shoulder-width apart on the object. Your left / right hand should be palm-up, and your other hand should be palm-down. 2. Keep your elbow straight and keep your shoulder muscles relaxed. Push the stick down  with your healthy arm to raise your left / right arm in front of your body, and then over your head until you feel a stretch in your shoulder.  Avoid shrugging your shoulder while you raise your arm. Keep your shoulder blade tucked down toward the middle of your back. 3. Hold for __________ seconds. 4. Slowly return to the starting position. Repeat __________ times. Complete this exercise __________ times a day. Exercise C: Abduction, Standing  1. Stand and hold a broomstick, a cane, or a similar object. Place your hands a little more than shoulder-width apart on the object. Your left / right hand should be palm-up, and your other hand should be palm-down. 2. While keeping your elbow straight and your shoulder muscles relaxed, push the stick across your body toward your left / right side. Raise your left / right arm to the side of your body and then over your head until you feel a stretch in your shoulder.  Do not raise your arm above shoulder height, unless your health care provider tells you to do that.  Avoid shrugging your shoulder while you raise your arm. Keep your shoulder blade tucked down toward the middle of your back. 3. Hold for __________ seconds. 4. Slowly return to the starting position. Repeat __________ times. Complete this exercise __________ times a day. Exercise D:Internal Rotation   1. Place your left / right hand behind your back, palm-up. 2. Use your other hand to dangle an exercise band, a towel, or a similar object over your   shoulder. Grasp the band with your left / right hand so you are holding onto both ends. 3. Gently pull up on the band until you feel a stretch in the front of your left / right shoulder.  Avoid shrugging your shoulder while you raise your arm. Keep your shoulder blade tucked down toward the middle of your back. 4. Hold for __________ seconds. 5. Release the stretch by letting go of the band and lowering your hands. Repeat __________ times.  Complete this exercise __________ times a day. STRETCHING EXERCISES  These exercises warm up your muscles and joints and improve the movement and flexibility of your shoulder. These exercises also help to relieve pain, numbness, and tingling. These exercises are done using your healthy shoulder to help stretch the muscles of your injured shoulder. Exercise E: Corner Stretch (External Rotation and Abduction)   1. Stand in a doorway with one of your feet slightly in front of the other. This is called a staggered stance. If you cannot reach your forearms to the door frame, stand facing a corner of a room. 2. Choose one of the following positions as told by your health care provider:  Place your hands and forearms on the door frame above your head.  Place your hands and forearms on the door frame at the height of your head.  Place your hands on the door frame at the height of your elbows. 3. Slowly move your weight onto your front foot until you feel a stretch across your chest and in the front of your shoulders. Keep your head and chest upright and keep your abdominal muscles tight. 4. Hold for __________ seconds. 5. To release the stretch, shift your weight to your back foot. Repeat __________ times. Complete this stretch __________ times a day. Exercise F:Extension, Standing  1. Stand and hold a broomstick, a cane, or a similar object behind your back.  Your hands should be a little wider than shoulder-width apart.  Your palms should face away from your back. 2. Keeping your elbows straight and keeping your shoulder muscles relaxed, move the stick away from your body until you feel a stretch in your shoulder.  Avoid shrugging your shoulders while you move the stick. Keep your shoulder blade tucked down toward the middle of your back. 3. Hold for __________ seconds. 4. Slowly return to the starting position. Repeat __________ times. Complete this exercise __________ times a  day. STRENGTHENING EXERCISES  These exercises build strength and endurance in your shoulder. Endurance is the ability to use your muscles for a long time, even after they get tired. Exercise G:External Rotation   1. Sit in a stable chair without armrests. 2. Secure an exercise band at elbow height on your left / right side. 3. Place a soft object, such as a folded towel or a small pillow, between your left / right upper arm and your body to move your elbow a few inches away (about 10 cm) from your side. 4. Hold the end of the band so it is tight and there is no slack. 5. Keeping your elbow pressed against the soft object, move your left / right forearm out, away from your abdomen. Keep your body steady so only your forearm moves. 6. Hold for __________ seconds. 7. Slowly return to the starting position. Repeat __________ times. Complete this exercise __________ times a day. Exercise H:Shoulder Abduction   1. Sit in a stable chair without armrests, or stand. 2. Hold a __________ weight in your left /   right hand, or hold an exercise band with both hands. 3. Start with your arms straight down and your left / right palm facing in, toward your body. 4. Slowly lift your left / right hand out to your side. Do not lift your hand above shoulder height unless your health care provider tells you that this is safe.  Keep your arms straight.  Avoid shrugging your shoulder while you do this movement. Keep your shoulder blade tucked down toward the middle of your back. 5. Hold for __________ seconds. 6. Slowly lower your arm, and return to the starting position. Repeat __________ times. Complete this exercise __________ times a day. Exercise I:Shoulder Extension  1. Sit in a stable chair without armrests, or stand. 2. Secure an exercise band to a stable object in front of you where it is at shoulder height. 3. Hold one end of the exercise band in each hand. Your palms should face each  other. 4. Straighten your elbows and lift your hands up to shoulder height. 5. Step back, away from the secured end of the exercise band, until the band is tight and there is no slack. 6. Squeeze your shoulder blades together as you pull your hands down to the sides of your thighs. Stop when your hands are straight down by your sides. Do not let your hands go behind your body. 7. Hold for __________ seconds. 8. Slowly return to the starting position. Repeat __________ times. Complete this exercise __________ times a day. Exercise J:Standing Shoulder Row  1. Sit in a stable chair without armrests, or stand. 2. Secure an exercise band to a stable object in front of you so it is at waist height. 3. Hold one end of the exercise band in each hand. Your palms should be in a thumbs-up position. 4. Bend each of your elbows to an "L" shape (about 90 degrees) and keep your upper arms at your sides. 5. Step back until the band is tight and there is no slack. 6. Slowly pull your elbows back behind you. 7. Hold for __________ seconds. 8. Slowly return to the starting position. Repeat __________ times. Complete this exercise __________ times a day. Exercise K:Shoulder Press-Ups   1. Sit in a stable chair that has armrests. Sit upright, with your feet flat on the floor. 2. Put your hands on the armrests so your elbows are bent and your fingers are pointing forward. Your hands should be about even with the sides of your body. 3. Push down on the armrests and use your arms to lift yourself off of the chair. Straighten your elbows and lift yourself up as much as you comfortably can.  Move your shoulder blades down, and avoid letting your shoulders move up toward your ears.  Keep your feet on the ground. As you get stronger, your feet should support less of your body weight as you lift yourself up. 4. Hold for __________ seconds. 5. Slowly lower yourself back into the chair. Repeat __________ times.  Complete this exercise __________ times a day. Exercise L: Wall Push-Ups   1. Stand so you are facing a stable wall. Your feet should be about one arm-length away from the wall. 2. Lean forward and place your palms on the wall at shoulder height. 3. Keep your feet flat on the floor as you bend your elbows and lean forward toward the wall. 4. Hold for __________ seconds. 5. Straighten your elbows to push yourself back to the starting position. Repeat __________ times. Complete   this exercise __________ times a day. This information is not intended to replace advice given to you by your health care provider. Make sure you discuss any questions you have with your health care provider. Document Released: 02/01/2005 Document Revised: 12/13/2015 Document Reviewed: 11/29/2014 Elsevier Interactive Patient Education  2017 [employer]  

## 2016-04-25 ENCOUNTER — Telehealth: Payer: Self-pay | Admitting: Internal Medicine

## 2016-04-25 NOTE — Telephone Encounter (Signed)
Patient dropped off a Certification of [employer] Provider form for the provider to fill out.  The form was put in the providers incoming prescription box.

## 2016-04-25 NOTE — Telephone Encounter (Signed)
Pt returned call about xray  Pt request cb  Thanks

## 2016-04-26 NOTE — Telephone Encounter (Signed)
Placed in your box 

## 2016-04-26 NOTE — Telephone Encounter (Signed)
This is a FMLA form that should have been given to [place].

## 2016-04-26 NOTE — Telephone Encounter (Signed)
Paperwork in [facility] in box  Pt stated she would like to go back to work 2/5

## 2016-04-27 NOTE — Telephone Encounter (Signed)
Done, placed on [facility] desk 

## 2016-04-27 NOTE — Telephone Encounter (Signed)
Paperwork faxed Copy for pt Copy for scan Copy for file Pt aware

## 2016-07-20 ENCOUNTER — Telehealth: Payer: Self-pay | Admitting: Internal Medicine

## 2016-07-20 NOTE — Telephone Encounter (Signed)
Called pt. No VM setup. Pt due for AWV/CPE  Teresa Fry call back # [phone]

## 2016-07-21 ENCOUNTER — Other Ambulatory Visit: Payer: Self-pay | Admitting: Internal Medicine

## 2016-07-21 DIAGNOSIS — I10 Essential (primary) hypertension: Secondary | ICD-10-CM

## 2016-08-09 ENCOUNTER — Encounter: Payer: Self-pay | Admitting: Internal Medicine

## 2016-08-09 ENCOUNTER — Ambulatory Visit (INDEPENDENT_AMBULATORY_CARE_PROVIDER_SITE_OTHER): Payer: Medicare Other | Admitting: Internal Medicine

## 2016-08-09 VITALS — BP 140/110 | HR 94 | Wt 202.0 lb

## 2016-08-09 DIAGNOSIS — R5383 Other fatigue: Secondary | ICD-10-CM | POA: Diagnosis not present

## 2016-08-09 DIAGNOSIS — M25511 Pain in right shoulder: Secondary | ICD-10-CM

## 2016-08-09 DIAGNOSIS — R51 Headache: Secondary | ICD-10-CM | POA: Diagnosis not present

## 2016-08-09 DIAGNOSIS — G8929 Other chronic pain: Secondary | ICD-10-CM | POA: Diagnosis not present

## 2016-08-09 DIAGNOSIS — E569 Vitamin deficiency, unspecified: Secondary | ICD-10-CM

## 2016-08-09 DIAGNOSIS — I10 Essential (primary) hypertension: Secondary | ICD-10-CM

## 2016-08-09 DIAGNOSIS — R29898 Other symptoms and signs involving the musculoskeletal system: Secondary | ICD-10-CM

## 2016-08-09 DIAGNOSIS — R519 Headache, unspecified: Secondary | ICD-10-CM

## 2016-08-09 MED ORDER — PREDNISONE 10 MG PO TABS: ORAL_TABLET | ORAL | 0 refills | Status: DC

## 2016-08-09 MED ORDER — SPIRONOLACTONE 25 MG PO TABS: 25.0000 mg | ORAL_TABLET | Freq: Every day | ORAL | 0 refills | Status: DC

## 2016-08-09 NOTE — Patient Instructions (Signed)
Hypertension °Hypertension is another name for high blood pressure. High blood pressure forces your heart to work harder to pump blood. This can cause problems over time. °There are two numbers in a blood pressure reading. There is a top number (systolic) over a bottom number (diastolic). It is best to have a blood pressure below 120/80. Healthy choices can help lower your blood pressure. You may need medicine to help lower your blood pressure if: °· Your blood pressure cannot be lowered with healthy choices. °· Your blood pressure is higher than 130/80. °Follow these instructions at home: °Eating and drinking  °· If directed, follow the DASH eating plan. This diet includes: °¨ Filling half of your plate at each meal with fruits and vegetables. °¨ Filling one quarter of your plate at each meal with whole grains. Whole grains include whole wheat pasta, brown rice, and whole grain bread. °¨ Eating or drinking low-fat dairy products, such as skim milk or low-fat yogurt. °¨ Filling one quarter of your plate at each meal with low-fat (lean) proteins. Low-fat proteins include fish, skinless chicken, eggs, beans, and tofu. °¨ Avoiding fatty meat, cured and processed meat, or chicken with skin. °¨ Avoiding premade or processed food. °· Eat less than 1,500 mg of salt (sodium) a day. °· Limit alcohol use to no more than 1 drink a day for nonpregnant women and 2 drinks a day for men. One drink equals 12 oz of beer, 5 oz of wine, or 1½ oz of hard liquor. °Lifestyle  °· Work with your doctor to stay at a healthy weight or to lose weight. Ask your doctor what the best weight is for you. °· Get at least 30 minutes of exercise that causes your heart to beat faster (aerobic exercise) most days of the week. This may include walking, swimming, or biking. °· Get at least 30 minutes of exercise that strengthens your muscles (resistance exercise) at least 3 days a week. This may include lifting weights or pilates. °· Do not use any  products that contain nicotine or tobacco. This includes cigarettes and e-cigarettes. If you need help quitting, ask your doctor. °· Check your blood pressure at home as told by your doctor. °· Keep all follow-up visits as told by your doctor. This is important. °Medicines  °· Take over-the-counter and prescription medicines only as told by your doctor. Follow directions carefully. °· Do not skip doses of blood pressure medicine. The medicine does not work as well if you skip doses. Skipping doses also puts you at risk for problems. °· Ask your doctor about side effects or reactions to medicines that you should watch for. °Contact a doctor if: °· You think you are having a reaction to the medicine you are taking. °· You have headaches that keep coming back (recurring). °· You feel dizzy. °· You have swelling in your ankles. °· You have trouble with your vision. °Get help right away if: °· You get a very bad headache. °· You start to feel confused. °· You feel weak or numb. °· You feel faint. °· You get very bad pain in your: °¨ Chest. °¨ Belly (abdomen). °· You throw up (vomit) more than once. °· You have trouble breathing. °Summary °· Hypertension is another name for high blood pressure. °· Making healthy choices can help lower blood pressure. If your blood pressure cannot be controlled with healthy choices, you may need to take medicine. °This information is not intended to replace advice given to you by your   health care provider. Make sure you discuss any questions you have with your health care provider. °Document Released: 09/06/2007 Document Revised: 02/16/2016 Document Reviewed: 02/16/2016 °Elsevier Interactive Patient Education © 2017 Elsevier Inc. ° °

## 2016-08-09 NOTE — Progress Notes (Signed)
Subjective:    Patient ID: Teresa Fry, female    DOB: [birth year], 71 y.o.   MRN: [id number]  HPI  Pt presents to the clinic today for follow up of right shoulder pain. She was seen for the same 04/12/16, diagnosed with subacromial bursitis. She was advised to try NSAID's and ice. She returned 04/24/16 with no improvement. Xray of the right shoulder showed no acute changes. Since that time, she reports her right shoulder causes her pain. She now feels like she has weakness in that arm. She denies numbness or tingling. She is only taking Advil as needed for the pain.  Pt also reports fatigue and headache. She gets 6-7 hours of broken sleep at night. She has to get up to urinate a few time. She does not feel rested when she wakes up. She often naps during the day. She does snore at night. She has never been told that she has sleep apnea.  HTN: Her BP today is 140/110. She is taking Lisinopril and Amlodipine as prescribed. She has been having headaches but denies chest pain or shortness of breath.  Review of Systems      Past Medical History:  Diagnosis Date  . Gallstones   . Hypertension     Current Outpatient Prescriptions  Medication Sig Dispense Refill  . amitriptyline (ELAVIL) 25 MG tablet Take 1 tablet (25 mg total) by mouth at bedtime. 30 tablet 1  . amLODipine (NORVASC) 10 MG tablet TAKE ONE TABLET BY MOUTH ONCE DAILY **MUST  SCHEDULE  ANNUAL  PHYSICAL** 30 tablet 0  . aspirin 81 MG tablet Take 81 mg by mouth daily.      Marland Kitchen lisinopril (PRINIVIL,ZESTRIL) 40 MG tablet TAKE ONE TABLET BY MOUTH ONCE DAILY **MUST  SCHEDULE  ANNUAL  PHYSICAL** 30 tablet 0  . Multiple Vitamin (MULTIVITAMIN) capsule Take 1 capsule by mouth daily.      . niacin 500 MG tablet Take 1,000 mg by mouth daily.     No current facility-administered medications for this visit.     No Known Allergies  Family History  Problem Relation Age of Onset  . Heart disease Father   . Cancer Daughter     breast     Social History   Social History  . Marital status: Married    Spouse name: N/A  . Number of children: N/A  . Years of education: N/A   Occupational History  . Not on file.   Social History Main Topics  . Smoking status: Never Smoker  . Smokeless tobacco: Never Used  . Alcohol use No  . Drug use: No  . Sexual activity: No   Other Topics Concern  . Not on file   Social History Narrative  . No narrative on file     Constitutional: Pt reports fatigue and headaches. Denies fever, malaise, or abrupt weight changes.  Respiratory: Denies difficulty breathing, shortness of breath, cough or sputum production.   Cardiovascular: Denies chest pain, chest tightness, palpitations or swelling in the hands or feet.  Musculoskeletal: Pt reports right shoulder pain and weakness. Denies difficulty with gait, muscle pain or joint swelling.    No other specific complaints in a complete review of systems (except as listed in HPI above).  Objective:   Physical Exam  BP (!) 140/110   Pulse 94   Wt 202 lb (91.6 kg)   SpO2 94%   BMI 33.61 kg/m  Wt Readings from Last 3 Encounters:  08/09/16 202 lb (91.6 kg)  04/24/16 207 lb (93.9 kg)  04/12/16 203 lb (92.1 kg)    General: Appears her stated age, obese in NAD. Cardiovascular: Normal rate and rhythm.  Pulmonary/Chest: Normal effort and positive vesicular breath sounds. No respiratory distress. No wheezes, rales or ronchi noted.  Musculoskeletal: Normal internal rotation. Difficulty with external rotation. Normal abduction and adduction. Pain with palpation over the [facility] joint and subacromial bursa. Strength 4/5 RUE, 5/5 LUE. Positive drop can on the right.  BMET    Component Value Date/Time   NA 139 12/02/2015 0113   K 3.3 (L) 12/02/2015 0113   CL 104 12/02/2015 0113   CO2 29 12/02/2015 0113   GLUCOSE 102 (H) 12/02/2015 0113   BUN 10 12/02/2015 0113   CREATININE 0.82 12/02/2015 0113   CALCIUM 9.5 12/02/2015 0113   GFRNONAA >60  12/02/2015 0113   GFRAA >60 12/02/2015 0113    Lipid Panel  No results found for: CHOL, TRIG, HDL, CHOLHDL, VLDL, LDLCALC  CBC    Component Value Date/Time   WBC 8.4 12/02/2015 0113   RBC 4.89 12/02/2015 0113   HGB 13.5 12/02/2015 0113   HCT 42.2 12/02/2015 0113   PLT 260 12/02/2015 0113   MCV 86.3 12/02/2015 0113   MCH 27.6 12/02/2015 0113   MCHC 32.0 12/02/2015 0113   RDW 15.0 12/02/2015 0113   LYMPHSABS 1.9 12/02/2015 0113   MONOABS 0.8 12/02/2015 0113   EOSABS 0.1 12/02/2015 0113   BASOSABS 0.0 12/02/2015 0113    Hgb A1C No results found for: HGBA1C          Assessment & Plan:   HTN:  Likely the cause of the headaches CMET today Continue Lisinopril and Amlodipine Add Spironolactone Discussed low salt diet and exercise for weight loss  Right Shoulder Pain and Weakness:  Concern fore rotator cuff tear Will try Pred Taper May need PT vs MRI vs ortho referral if no improvement  Fatigue:  ? OSA Will check CBC, TSH, B12 and Vit D   RTC in 3 weeks for BP check BAITY, REGINA, NP

## 2016-08-10 LAB — COMPREHENSIVE METABOLIC PANEL
ALBUMIN: 4.2 g/dL (ref 3.5–5.2)
ALK PHOS: 74 U/L (ref 39–117)
ALT: 18 U/L (ref 0–35)
AST: 25 U/L (ref 0–37)
BILIRUBIN TOTAL: 0.3 mg/dL (ref 0.2–1.2)
BUN: 13 mg/dL (ref 6–23)
CALCIUM: 9.8 mg/dL (ref 8.4–10.5)
CO2: 31 meq/L (ref 19–32)
CREATININE: 0.87 mg/dL (ref 0.40–1.20)
Chloride: 102 mEq/L (ref 96–112)
GFR: 82.94 mL/min (ref >60.00)
Glucose, Bld: 116 mg/dL — ABNORMAL HIGH (ref 70–99)
Potassium: 3.4 mEq/L — ABNORMAL LOW (ref 3.5–5.1)
Sodium: 142 mEq/L (ref 135–145)
Total Protein: 7.8 g/dL (ref 6.0–8.3)

## 2016-08-10 LAB — CBC
HEMATOCRIT: 42 % (ref 36.0–46.0)
Hemoglobin: 13.5 g/dL (ref 12.0–15.0)
MCHC: 32.1 g/dL (ref 30.0–36.0)
MCV: 86.5 fl (ref 78.0–100.0)
Platelets: 271 10*3/uL (ref 150.0–400.0)
RBC: 4.86 Mil/uL (ref 3.87–5.11)
RDW: 14.1 % (ref 11.5–15.5)
WBC: 8.2 10*3/uL (ref 4.0–10.5)

## 2016-08-10 LAB — VITAMIN D 25 HYDROXY (VIT D DEFICIENCY, FRACTURES): VITD: 54.79 ng/mL (ref 30.00–100.00)

## 2016-08-10 LAB — VITAMIN B12: Vitamin B-12: 750 pg/mL (ref 211–911)

## 2016-08-10 LAB — TSH: TSH: 1.6 u[IU]/mL (ref 0.35–4.50)

## 2016-08-29 ENCOUNTER — Encounter: Payer: Self-pay | Admitting: Internal Medicine

## 2016-08-29 ENCOUNTER — Ambulatory Visit (INDEPENDENT_AMBULATORY_CARE_PROVIDER_SITE_OTHER): Payer: Medicare Other | Admitting: Internal Medicine

## 2016-08-29 VITALS — BP 132/80 | HR 83 | Temp 98.3°F | Wt 202.0 lb

## 2016-08-29 DIAGNOSIS — I10 Essential (primary) hypertension: Secondary | ICD-10-CM | POA: Diagnosis not present

## 2016-08-29 DIAGNOSIS — H5369 Other night blindness: Secondary | ICD-10-CM | POA: Diagnosis not present

## 2016-08-29 MED ORDER — SPIRONOLACTONE 25 MG PO TABS: 25.0000 mg | ORAL_TABLET | Freq: Every day | ORAL | 2 refills | Status: DC

## 2016-08-29 NOTE — Progress Notes (Signed)
Subjective:    Patient ID: Teresa Fry, female    DOB: Oct 02, 1945, 71 y.o.   MRN: [id number]  HPI  Pt presents to the clinic today for 3 week follow up of HTN. She was started on Aldactone at her last visit. She was advised to continue her Amlodipine and Lisinopril. She has been taking the medication as prescribed. She denies adverse side effects. She has been having some muscle cramping. She reports her headaches have improved. Her BP today is 132/80.  She is requesting a referral to opthalmology for an eye exam. She reports difficulty seeing at night.  Review of Systems      Past Medical History:  Diagnosis Date  . Gallstones   . Hypertension     Current Outpatient Prescriptions  Medication Sig Dispense Refill  . amitriptyline (ELAVIL) 25 MG tablet Take 1 tablet (25 mg total) by mouth at bedtime. 30 tablet 1  . amLODipine (NORVASC) 10 MG tablet TAKE ONE TABLET BY MOUTH ONCE DAILY **MUST  SCHEDULE  ANNUAL  PHYSICAL** 30 tablet 0  . aspirin 81 MG tablet Take 81 mg by mouth daily.      Marland Kitchen lisinopril (PRINIVIL,ZESTRIL) 40 MG tablet TAKE ONE TABLET BY MOUTH ONCE DAILY **MUST  SCHEDULE  ANNUAL  PHYSICAL** 30 tablet 0  . Multiple Vitamin (MULTIVITAMIN) capsule Take 1 capsule by mouth daily.      . niacin 500 MG tablet Take 1,000 mg by mouth daily.    . predniSONE (DELTASONE) 10 MG tablet Take 6 tabs day 1, 5 tabs day 2, 4 tabs day 3, 3 tabs day 4, 2 tabs day 5, 1 tab day 6 21 tablet 0  . spironolactone (ALDACTONE) 25 MG tablet Take 1 tablet (25 mg total) by mouth daily. 30 tablet 0   No current facility-administered medications for this visit.     No Known Allergies  Family History  Problem Relation Age of Onset  . Heart disease Father   . Cancer Daughter        breast    Social History   Social History  . Marital status: Married    Spouse name: N/A  . Number of children: N/A  . Years of education: N/A   Occupational History  . Not on file.   Social History Main  Topics  . Smoking status: Never Smoker  . Smokeless tobacco: Never Used  . Alcohol use No  . Drug use: No  . Sexual activity: No   Other Topics Concern  . Not on file   Social History Narrative  . No narrative on file     Constitutional: Denies fever, malaise, fatigue, headache or abrupt weight changes.  Respiratory: Denies difficulty breathing, shortness of breath, cough or sputum production.   Cardiovascular: Denies chest pain, chest tightness, palpitations or swelling in the hands or feet.  Musculoskeletal: Pt reports muscle cramping in legs. Denies decrease in range of motion, difficulty with gait, or joint pain and swelling.  Neurological: Denies dizziness, difficulty with memory, difficulty with speech or problems with balance and coordination.    No other specific complaints in a complete review of systems (except as listed in HPI above).  Objective:   Physical Exam   BP 132/80   Pulse 83   Temp 98.3 F (36.8 C) (Oral)   Wt 202 lb (91.6 kg)   SpO2 98%   BMI 33.61 kg/m  Wt Readings from Last 3 Encounters:  08/29/16 202 lb (91.6 kg)  08/09/16 202 lb (91.6  kg)  04/24/16 207 lb (93.9 kg)    General: Appears her stated age, well developed, well nourished in NAD. Cardiovascular: Normal rate and rhythm. S1,S2 noted.  No murmur, rubs or gallops noted. Pulmonary/Chest: Normal effort and positive vesicular breath sounds. No respiratory distress. No wheezes, rales or ronchi noted.  Musculoskeletal: No difficulty with gait.  Neurological: Alert and oriented.   BMET    Component Value Date/Time   NA 142 08/09/2016 1612   K 3.4 (L) 08/09/2016 1612   CL 102 08/09/2016 1612   CO2 31 08/09/2016 1612   GLUCOSE 116 (H) 08/09/2016 1612   BUN 13 08/09/2016 1612   CREATININE 0.87 08/09/2016 1612   CALCIUM 9.8 08/09/2016 1612   GFRNONAA >60 12/02/2015 0113   GFRAA >60 12/02/2015 0113    Lipid Panel  No results found for: CHOL, TRIG, HDL, CHOLHDL, VLDL, LDLCALC  CBC     Component Value Date/Time   WBC 8.2 08/09/2016 1612   RBC 4.86 08/09/2016 1612   HGB 13.5 08/09/2016 1612   HCT 42.0 08/09/2016 1612   PLT 271.0 08/09/2016 1612   MCV 86.5 08/09/2016 1612   MCH 27.6 12/02/2015 0113   MCHC 32.1 08/09/2016 1612   RDW 14.1 08/09/2016 1612   LYMPHSABS 1.9 12/02/2015 0113   MONOABS 0.8 12/02/2015 0113   EOSABS 0.1 12/02/2015 0113   BASOSABS 0.0 12/02/2015 0113    Hgb A1C No results found for: HGBA1C         Assessment & Plan:

## 2016-08-29 NOTE — Patient Instructions (Signed)
Hypertension °Hypertension is another name for high blood pressure. High blood pressure forces your heart to work harder to pump blood. This can cause problems over time. °There are two numbers in a blood pressure reading. There is a top number (systolic) over a bottom number (diastolic). It is best to have a blood pressure below 120/80. Healthy choices can help lower your blood pressure. You may need medicine to help lower your blood pressure if: °· Your blood pressure cannot be lowered with healthy choices. °· Your blood pressure is higher than 130/80. °Follow these instructions at home: °Eating and drinking  °· If directed, follow the DASH eating plan. This diet includes: °¨ Filling half of your plate at each meal with fruits and vegetables. °¨ Filling one quarter of your plate at each meal with whole grains. Whole grains include whole wheat pasta, brown rice, and whole grain bread. °¨ Eating or drinking low-fat dairy products, such as skim milk or low-fat yogurt. °¨ Filling one quarter of your plate at each meal with low-fat (lean) proteins. Low-fat proteins include fish, skinless chicken, eggs, beans, and tofu. °¨ Avoiding fatty meat, cured and processed meat, or chicken with skin. °¨ Avoiding premade or processed food. °· Eat less than 1,500 mg of salt (sodium) a day. °· Limit alcohol use to no more than 1 drink a day for nonpregnant women and 2 drinks a day for men. One drink equals 12 oz of beer, 5 oz of wine, or 1½ oz of hard liquor. °Lifestyle  °· Work with your doctor to stay at a healthy weight or to lose weight. Ask your doctor what the best weight is for you. °· Get at least 30 minutes of exercise that causes your heart to beat faster (aerobic exercise) most days of the week. This may include walking, swimming, or biking. °· Get at least 30 minutes of exercise that strengthens your muscles (resistance exercise) at least 3 days a week. This may include lifting weights or pilates. °· Do not use any  products that contain nicotine or tobacco. This includes cigarettes and e-cigarettes. If you need help quitting, ask your doctor. °· Check your blood pressure at home as told by your doctor. °· Keep all follow-up visits as told by your doctor. This is important. °Medicines  °· Take over-the-counter and prescription medicines only as told by your doctor. Follow directions carefully. °· Do not skip doses of blood pressure medicine. The medicine does not work as well if you skip doses. Skipping doses also puts you at risk for problems. °· Ask your doctor about side effects or reactions to medicines that you should watch for. °Contact a doctor if: °· You think you are having a reaction to the medicine you are taking. °· You have headaches that keep coming back (recurring). °· You feel dizzy. °· You have swelling in your ankles. °· You have trouble with your vision. °Get help right away if: °· You get a very bad headache. °· You start to feel confused. °· You feel weak or numb. °· You feel faint. °· You get very bad pain in your: °¨ Chest. °¨ Belly (abdomen). °· You throw up (vomit) more than once. °· You have trouble breathing. °Summary °· Hypertension is another name for high blood pressure. °· Making healthy choices can help lower blood pressure. If your blood pressure cannot be controlled with healthy choices, you may need to take medicine. °This information is not intended to replace advice given to you by your   health care provider. Make sure you discuss any questions you have with your health care provider. °Document Released: 09/06/2007 Document Revised: 02/16/2016 Document Reviewed: 02/16/2016 °Elsevier Interactive Patient Education © 2017 Elsevier Inc. ° °

## 2016-08-29 NOTE — Assessment & Plan Note (Signed)
Improved Continue Amlodpine, Lisinopril and Spironolactone BMET today

## 2016-08-30 ENCOUNTER — Other Ambulatory Visit: Payer: Self-pay | Admitting: Internal Medicine

## 2016-08-30 DIAGNOSIS — I10 Essential (primary) hypertension: Secondary | ICD-10-CM

## 2016-08-30 LAB — BASIC METABOLIC PANEL
BUN: 13 mg/dL (ref 6–23)
CHLORIDE: 104 meq/L (ref 96–112)
CO2: 29 meq/L (ref 19–32)
Calcium: 9.8 mg/dL (ref 8.4–10.5)
Creatinine, Ser: 0.88 mg/dL (ref 0.40–1.20)
GFR: 81.84 mL/min (ref >60.00)
Glucose, Bld: 131 mg/dL — ABNORMAL HIGH (ref 70–99)
Potassium: 3.6 mEq/L (ref 3.5–5.1)
SODIUM: 141 meq/L (ref 135–145)

## 2016-08-30 NOTE — Telephone Encounter (Signed)
Letter mailed notifying pt that a CPE is due

## 2016-09-01 NOTE — Telephone Encounter (Signed)
Called pt. No VM setup. Pt due for AWV/CPE  Ebony Hail call back # [phone]

## 2016-09-06 ENCOUNTER — Encounter: Payer: Self-pay | Admitting: Internal Medicine

## 2016-10-24 ENCOUNTER — Other Ambulatory Visit: Payer: Self-pay

## 2016-11-03 ENCOUNTER — Telehealth: Payer: Self-pay | Admitting: Internal Medicine

## 2016-11-03 NOTE — Telephone Encounter (Signed)
Left pt daughter message asking to call Ebony Hail back directly at [phone] to schedule AWV + labs with Katha Cabal and CPE with PCP or to verify # for pt  *NOTE* Never had AWV

## 2016-11-23 ENCOUNTER — Other Ambulatory Visit: Payer: Self-pay | Admitting: Internal Medicine

## 2016-11-23 DIAGNOSIS — I10 Essential (primary) hypertension: Secondary | ICD-10-CM

## 2016-12-12 NOTE — Telephone Encounter (Signed)
2nd attempt to schedule AWV. No vm set up.  No hx of AWV

## 2016-12-25 ENCOUNTER — Other Ambulatory Visit: Payer: Self-pay | Admitting: Internal Medicine

## 2016-12-25 DIAGNOSIS — I10 Essential (primary) hypertension: Secondary | ICD-10-CM

## 2017-01-11 ENCOUNTER — Other Ambulatory Visit: Payer: Self-pay | Admitting: Internal Medicine

## 2017-01-11 DIAGNOSIS — I10 Essential (primary) hypertension: Secondary | ICD-10-CM

## 2017-01-26 ENCOUNTER — Ambulatory Visit: Payer: Medicare Other | Admitting: Internal Medicine

## 2017-01-31 ENCOUNTER — Encounter: Payer: Self-pay | Admitting: Internal Medicine

## 2017-01-31 ENCOUNTER — Ambulatory Visit (INDEPENDENT_AMBULATORY_CARE_PROVIDER_SITE_OTHER): Payer: Medicare Other | Admitting: Internal Medicine

## 2017-01-31 VITALS — BP 130/84 | HR 81 | Temp 98.0°F | Wt 203.0 lb

## 2017-01-31 DIAGNOSIS — M25511 Pain in right shoulder: Secondary | ICD-10-CM | POA: Diagnosis not present

## 2017-01-31 DIAGNOSIS — M545 Low back pain, unspecified: Secondary | ICD-10-CM

## 2017-01-31 DIAGNOSIS — Z23 Encounter for immunization: Secondary | ICD-10-CM

## 2017-01-31 NOTE — Patient Instructions (Signed)

## 2017-01-31 NOTE — Progress Notes (Signed)
Subjective:    Patient ID: Teresa Fry, female    DOB: 1946/01/28, 71 y.o.   MRN: [id number]  HPI  Pt presents to the clinic today with c/o intermittent low back pain. She reports this has been an ongoing intermittent issues. She reports the pain is located across her entire lower back. She describes the pain as sore and achy. The pain does not radiate down her legs. She denies loss of bowel or bladder. She takes Ibuprofen or Aleve with some relief, but reports she is afraid to take it daily.  She also c/o right shoulder pain. This is also and ongoing intermittent issue since a fall that occurred 04/2016. Xray of the right shoulder at that time showed some arthritic changes. She describes the pain as sore and achy. The pain does not radiate. She denies numbness, tingling or weakness. She has taken Ibuprofen and Aleve with some relief.  She would like her flu shot today.   Review of Systems  Past Medical History:  Diagnosis Date  . Gallstones   . Hypertension     Current Outpatient Prescriptions  Medication Sig Dispense Refill  . amitriptyline (ELAVIL) 25 MG tablet Take 1 tablet (25 mg total) by mouth at bedtime. 30 tablet 1  . amLODipine (NORVASC) 10 MG tablet TAKE 1 TABLET BY MOUTH DAILY ( MUST SCHEDULE ANNUAL EXAM FOR REFILLS) 30 tablet 0  . aspirin 81 MG tablet Take 81 mg by mouth daily.      Marland Kitchen lisinopril (PRINIVIL,ZESTRIL) 40 MG tablet Take 1 tablet (40 mg total) by mouth daily. MUST SCHEDULE ANNUAL EXAM FOR REFILLS 30 tablet 1  . Multiple Vitamin (MULTIVITAMIN) capsule Take 1 capsule by mouth daily.      . niacin 500 MG tablet Take 1,000 mg by mouth daily.    Marland Kitchen spironolactone (ALDACTONE) 25 MG tablet TAKE 1 TABLET BY MOUTH ONCE DAILY 30 tablet 1   No current facility-administered medications for this visit.     No Known Allergies  Family History  Problem Relation Age of Onset  . Heart disease Father   . Cancer Daughter        breast    Social History   Social History   . Marital status: Married    Spouse name: N/A  . Number of children: N/A  . Years of education: N/A   Occupational History  . Not on file.   Social History Main Topics  . Smoking status: Never Smoker  . Smokeless tobacco: Never Used  . Alcohol use No  . Drug use: No  . Sexual activity: No   Other Topics Concern  . Not on file   Social History Narrative  . No narrative on file     Constitutional: Denies fever, malaise, fatigue, headache or abrupt weight changes.  Musculoskeletal: Pt reports back pain and shoulder pain. . Denies decrease in range of motion, difficulty with gait, muscle pain or joint swelling.   No other specific complaints in a complete review of systems (except as listed in HPI above).     Objective:   Physical Exam   BP 130/84   Pulse 81   Temp 98 F (36.7 C) (Oral)   Wt 203 lb (92.1 kg)   SpO2 98%   BMI 33.78 kg/m  Wt Readings from Last 3 Encounters:  01/31/17 203 lb (92.1 kg)  08/29/16 202 lb (91.6 kg)  08/09/16 202 lb (91.6 kg)    General: Appears her stated age, obese in NAD. Musculoskeletal: Normal  internal and external range of motion of the right shoulder. Normal abduction and adduction. Pain with palpation over the right subacromial bursa. Strength 5/5 BUE. Normal flexion, extension and rotation of the spine. No bony tenderness noted over the spine. Tenderness with palpation of the bilateral paralumbar muscles. No difficulty with gait.    BMET    Component Value Date/Time   NA 141 08/29/2016 1526   K 3.6 08/29/2016 1526   CL 104 08/29/2016 1526   CO2 29 08/29/2016 1526   GLUCOSE 131 (H) 08/29/2016 1526   BUN 13 08/29/2016 1526   CREATININE 0.88 08/29/2016 1526   CALCIUM 9.8 08/29/2016 1526   GFRNONAA >60 12/02/2015 0113   GFRAA >60 12/02/2015 0113    Lipid Panel  No results found for: CHOL, TRIG, HDL, CHOLHDL, VLDL, LDLCALC  CBC    Component Value Date/Time   WBC 8.2 08/09/2016 1612   RBC 4.86 08/09/2016 1612   HGB  13.5 08/09/2016 1612   HCT 42.0 08/09/2016 1612   PLT 271.0 08/09/2016 1612   MCV 86.5 08/09/2016 1612   MCH 27.6 12/02/2015 0113   MCHC 32.1 08/09/2016 1612   RDW 14.1 08/09/2016 1612   LYMPHSABS 1.9 12/02/2015 0113   MONOABS 0.8 12/02/2015 0113   EOSABS 0.1 12/02/2015 0113   BASOSABS 0.0 12/02/2015 0113    Hgb A1C No results found for: HGBA1C         Assessment & Plan:   Right Shoulder Pain:  ? If she has some ongoing unresolved bursitis Discussed take Aleve every 12 hours x 1 week to see if that helps If no improvement, consider Prednisone vs PT  Lower Back Pain:  Discussed how weight loss can help improve her back pain Discussed back exercises Discussed taking Aleve every 12 hours x 1 week to see if that helps If no improvement, consider PT  Make an appt for your Medicare Wellness Exam Webb Silversmith, NP

## 2017-02-02 ENCOUNTER — Other Ambulatory Visit: Payer: Self-pay | Admitting: Internal Medicine

## 2017-02-02 DIAGNOSIS — I10 Essential (primary) hypertension: Secondary | ICD-10-CM

## 2017-02-20 ENCOUNTER — Encounter: Payer: Self-pay | Admitting: Internal Medicine

## 2017-02-20 ENCOUNTER — Ambulatory Visit (INDEPENDENT_AMBULATORY_CARE_PROVIDER_SITE_OTHER): Payer: Medicare Other | Admitting: Internal Medicine

## 2017-02-20 VITALS — BP 132/86 | HR 76 | Temp 98.4°F | Ht 64.25 in | Wt 200.0 lb

## 2017-02-20 DIAGNOSIS — I10 Essential (primary) hypertension: Secondary | ICD-10-CM | POA: Diagnosis not present

## 2017-02-20 DIAGNOSIS — Z0000 Encounter for general adult medical examination without abnormal findings: Secondary | ICD-10-CM

## 2017-02-20 DIAGNOSIS — Z0001 Encounter for general adult medical examination with abnormal findings: Secondary | ICD-10-CM

## 2017-02-20 DIAGNOSIS — Z114 Encounter for screening for human immunodeficiency virus [HIV]: Secondary | ICD-10-CM | POA: Diagnosis not present

## 2017-02-20 DIAGNOSIS — Z1159 Encounter for screening for other viral diseases: Secondary | ICD-10-CM | POA: Diagnosis not present

## 2017-02-20 DIAGNOSIS — Z23 Encounter for immunization: Secondary | ICD-10-CM

## 2017-02-20 DIAGNOSIS — Z13220 Encounter for screening for lipoid disorders: Secondary | ICD-10-CM | POA: Diagnosis not present

## 2017-02-20 DIAGNOSIS — N950 Postmenopausal bleeding: Secondary | ICD-10-CM | POA: Diagnosis not present

## 2017-02-20 DIAGNOSIS — E559 Vitamin D deficiency, unspecified: Secondary | ICD-10-CM

## 2017-02-20 NOTE — Patient Instructions (Signed)
Health Maintenance for Postmenopausal Women Menopause is a normal process in which your reproductive ability comes to an end. This process happens gradually over a span of months to years, usually between the ages of 22 and 9. Menopause is complete when you have missed 12 consecutive menstrual periods. It is important to talk with your health care provider about some of the most common conditions that affect postmenopausal women, such as heart disease, cancer, and bone loss (osteoporosis). Adopting a healthy lifestyle and getting preventive care can help to promote your health and wellness. Those actions can also lower your chances of developing some of these common conditions. What should I know about menopause? During menopause, you may experience a number of symptoms, such as:  Moderate-to-severe hot flashes.  Night sweats.  Decrease in sex drive.  Mood swings.  Headaches.  Tiredness.  Irritability.  Memory problems.  Insomnia.  Choosing to treat or not to treat menopausal changes is an individual decision that you make with your health care provider. What should I know about hormone replacement therapy and supplements? Hormone therapy products are effective for treating symptoms that are associated with menopause, such as hot flashes and night sweats. Hormone replacement carries certain risks, especially as you become older. If you are thinking about using estrogen or estrogen with progestin treatments, discuss the benefits and risks with your health care provider. What should I know about heart disease and stroke? Heart disease, heart attack, and stroke become more likely as you age. This may be due, in part, to the hormonal changes that your body experiences during menopause. These can affect how your body processes dietary fats, triglycerides, and cholesterol. Heart attack and stroke are both medical emergencies. There are many things that you can do to help prevent heart disease  and stroke:  Have your blood pressure checked at least every 1-2 years. High blood pressure causes heart disease and increases the risk of stroke.  If you are 53-22 years old, ask your health care provider if you should take aspirin to prevent a heart attack or a stroke.  Do not use any tobacco products, including cigarettes, chewing tobacco, or electronic cigarettes. If you need help quitting, ask your health care provider.  It is important to eat a healthy diet and maintain a healthy weight. ? Be sure to include plenty of vegetables, fruits, low-fat dairy products, and lean protein. ? Avoid eating foods that are high in solid fats, added sugars, or salt (sodium).  Get regular exercise. This is one of the most important things that you can do for your health. ? Try to exercise for at least 150 minutes each week. The type of exercise that you do should increase your heart rate and make you sweat. This is known as moderate-intensity exercise. ? Try to do strengthening exercises at least twice each week. Do these in addition to the moderate-intensity exercise.  Know your numbers.Ask your health care provider to check your cholesterol and your blood glucose. Continue to have your blood tested as directed by your health care provider.  What should I know about cancer screening? There are several types of cancer. Take the following steps to reduce your risk and to catch any cancer development as early as possible. Breast Cancer  Practice breast self-awareness. ? This means understanding how your breasts normally appear and feel. ? It also means doing regular breast self-exams. Let your health care provider know about any changes, no matter how small.  If you are 40  or older, have a clinician do a breast exam (clinical breast exam or CBE) every year. Depending on your age, family history, and medical history, it may be recommended that you also have a yearly breast X-ray (mammogram).  If you  have a family history of breast cancer, talk with your health care provider about genetic screening.  If you are at high risk for breast cancer, talk with your health care provider about having an MRI and a mammogram every year.  Breast cancer (BRCA) gene test is recommended for women who have family members with BRCA-related cancers. Results of the assessment will determine the need for genetic counseling and BRCA1 and for BRCA2 testing. BRCA-related cancers include these types: ? Breast. This occurs in males or females. ? Ovarian. ? Tubal. This may also be called fallopian tube cancer. ? Cancer of the abdominal or pelvic lining (peritoneal cancer). ? Prostate. ? Pancreatic.  Cervical, Uterine, and Ovarian Cancer Your health care provider may recommend that you be screened regularly for cancer of the pelvic organs. These include your ovaries, uterus, and vagina. This screening involves a pelvic exam, which includes checking for microscopic changes to the surface of your cervix (Pap test).  For women ages 21-65, health care providers may recommend a pelvic exam and a Pap test every three years. For women ages 79-65, they may recommend the Pap test and pelvic exam, combined with testing for human papilloma virus (HPV), every five years. Some types of HPV increase your risk of cervical cancer. Testing for HPV may also be done on women of any age who have unclear Pap test results.  Other health care providers may not recommend any screening for nonpregnant women who are considered low risk for pelvic cancer and have no symptoms. Ask your health care provider if a screening pelvic exam is right for you.  If you have had past treatment for cervical cancer or a condition that could lead to cancer, you need Pap tests and screening for cancer for at least 20 years after your treatment. If Pap tests have been discontinued for you, your risk factors (such as having a new sexual partner) need to be  reassessed to determine if you should start having screenings again. Some women have medical problems that increase the chance of getting cervical cancer. In these cases, your health care provider may recommend that you have screening and Pap tests more often.  If you have a family history of uterine cancer or ovarian cancer, talk with your health care provider about genetic screening.  If you have vaginal bleeding after reaching menopause, tell your health care provider.  There are currently no reliable tests available to screen for ovarian cancer.  Lung Cancer Lung cancer screening is recommended for adults 69-62 years old who are at high risk for lung cancer because of a history of smoking. A yearly low-dose CT scan of the lungs is recommended if you:  Currently smoke.  Have a history of at least 30 pack-years of smoking and you currently smoke or have quit within the past 15 years. A pack-year is smoking an average of one pack of cigarettes per day for one year.  Yearly screening should:  Continue until it has been 15 years since you quit.  Stop if you develop a health problem that would prevent you from having lung cancer treatment.  Colorectal Cancer  This type of cancer can be detected and can often be prevented.  Routine colorectal cancer screening usually begins at  age 42 and continues through age 45.  If you have risk factors for colon cancer, your health care provider may recommend that you be screened at an earlier age.  If you have a family history of colorectal cancer, talk with your health care provider about genetic screening.  Your health care provider may also recommend using home test kits to check for hidden blood in your stool.  A small camera at the end of a tube can be used to examine your colon directly (sigmoidoscopy or colonoscopy). This is done to check for the earliest forms of colorectal cancer.  Direct examination of the colon should be repeated every  5-10 years until age 71. However, if early forms of precancerous polyps or small growths are found or if you have a family history or genetic risk for colorectal cancer, you may need to be screened more often.  Skin Cancer  Check your skin from head to toe regularly.  Monitor any moles. Be sure to tell your health care provider: ? About any new moles or changes in moles, especially if there is a change in a mole's shape or color. ? If you have a mole that is larger than the size of a pencil eraser.  If any of your family members has a history of skin cancer, especially at a young age, talk with your health care provider about genetic screening.  Always use sunscreen. Apply sunscreen liberally and repeatedly throughout the day.  Whenever you are outside, protect yourself by wearing long sleeves, pants, a wide-brimmed hat, and sunglasses.  What should I know about osteoporosis? Osteoporosis is a condition in which bone destruction happens more quickly than new bone creation. After menopause, you may be at an increased risk for osteoporosis. To help prevent osteoporosis or the bone fractures that can happen because of osteoporosis, the following is recommended:  If you are 46-71 years old, get at least 1,000 mg of calcium and at least 600 mg of vitamin D per day.  If you are older than age 55 but younger than age 65, get at least 1,200 mg of calcium and at least 600 mg of vitamin D per day.  If you are older than age 54, get at least 1,200 mg of calcium and at least 800 mg of vitamin D per day.  Smoking and excessive alcohol intake increase the risk of osteoporosis. Eat foods that are rich in calcium and vitamin D, and do weight-bearing exercises several times each week as directed by your health care provider. What should I know about how menopause affects my mental health? Depression may occur at any age, but it is more common as you become older. Common symptoms of depression  include:  Low or sad mood.  Changes in sleep patterns.  Changes in appetite or eating patterns.  Feeling an overall lack of motivation or enjoyment of activities that you previously enjoyed.  Frequent crying spells.  Talk with your health care provider if you think that you are experiencing depression. What should I know about immunizations? It is important that you get and maintain your immunizations. These include:  Tetanus, diphtheria, and pertussis (Tdap) booster vaccine.  Influenza every year before the flu season begins.  Pneumonia vaccine.  Shingles vaccine.  Your health care provider may also recommend other immunizations. This information is not intended to replace advice given to you by your health care provider. Make sure you discuss any questions you have with your health care provider. Document Released: 05/12/2005  Document Revised: 10/08/2015 Document Reviewed: 12/22/2014 [employer] Interactive Patient Education  2018 [employer]  

## 2017-02-20 NOTE — Progress Notes (Signed)
HPI:  Pt presents to the clinic today for her Medicare Wellness Exam. She is also due to follow up chronic conditions.  HTN: Her BP today is 132/86. She is taking Amlodipine, Lisinopril and Sprironolactone as prescribed. ECG from 02/2011 reviewed.  Past Medical History:  Diagnosis Date  . Gallstones   . Hypertension     Current Outpatient Medications  Medication Sig Dispense Refill  . amitriptyline (ELAVIL) 25 MG tablet Take 1 tablet (25 mg total) by mouth at bedtime. 30 tablet 1  . amLODipine (NORVASC) 10 MG tablet TAKE 1 TABLET BY MOUTH ONCE DAILY (MUST  SCHEDULE  ANNUAL  EXAM  FOR  REFILLS) 30 tablet 0  . aspirin 81 MG tablet Take 81 mg by mouth daily.      Marland Kitchen lisinopril (PRINIVIL,ZESTRIL) 40 MG tablet TAKE 1 TABLET BY MOUTH  DAILY,(  MUST SCHEDULE ANNUAL EXAM FOR REFILLS ) 30 tablet 1  . Multiple Vitamin (MULTIVITAMIN) capsule Take 1 capsule by mouth daily.      . niacin 500 MG tablet Take 1,000 mg by mouth daily.    Marland Kitchen spironolactone (ALDACTONE) 25 MG tablet TAKE 1 TABLET BY MOUTH ONCE DAILY 30 tablet 1   No current facility-administered medications for this visit.     No Known Allergies  Family History  Problem Relation Age of Onset  . Heart disease Father   . Cancer Daughter        breast    Social History   Socioeconomic History  . Marital status: Married    Spouse name: Not on file  . Number of children: Not on file  . Years of education: Not on file  . Highest education level: Not on file  Social Needs  . Financial resource strain: Not on file  . Food insecurity - worry: Not on file  . Food insecurity - inability: Not on file  . Transportation needs - medical: Not on file  . Transportation needs - non-medical: Not on file  Occupational History  . Not on file  Tobacco Use  . Smoking status: Never Smoker  . Smokeless tobacco: Never Used  Substance and Sexual Activity  . Alcohol use: No    Alcohol/week: 0.0 oz  . Drug use: No  . Sexual activity: No   Other Topics Concern  . Not on file  Social History Narrative  . Not on file    Hospitiliaztions: None  Health Maintenance:    Flu: 01/2017  Tetanus: > 10 years ago  Pneumovax: never  Prevnar: never  Shingles Vaccine: never  Mammogram: > 5 years  Pap Smear: > 5 years ago  Bone Density: unsure  Colon Screening: never  Eye Doctor: as needed  Dental Exam: as needed    Providers:   PCP: Webb Silversmith, NP-C   I have personally reviewed and have noted:  1. The patient's medical and social history 2. Their use of alcohol, tobacco or illicit drugs 3. Their current medications and supplements 4. The patient's functional ability including ADL's, fall risks, home safety risks and hearing or visual impairment. 5. Diet and physical activities 6. Evidence for depression or mood disorder  Subjective:   Review of Systems:   Constitutional: Denies fever, malaise, fatigue, headache or abrupt weight changes.  HEENT: Denies eye pain, eye redness, ear pain, ringing in the ears, wax buildup, runny nose, nasal congestion, bloody nose, or sore throat. Respiratory: Denies difficulty breathing, shortness of breath, cough or sputum production.   Cardiovascular: Denies chest pain, chest tightness,  palpitations or swelling in the hands or feet.  Gastrointestinal: Denies abdominal pain, bloating, constipation, diarrhea or blood in the stool.  GU: Pt reports intermittent vaginal bleeding. Denies urgency, frequency, pain with urination, burning sensation, blood in urine, odor or discharge. Musculoskeletal: Pt reports intermittent right shoulder pain. Denies decrease in range of motion, difficulty with gait, muscle pain or joint swelling.  Skin: Denies redness, rashes, lesions or ulcercations.  Neurological: Denies dizziness, difficulty with memory, difficulty with speech or problems with balance and coordination.  Psych: Denies anxiety, depression, SI/HI.  No other specific complaints in a  complete review of systems (except as listed in HPI above).  Objective:  PE:   BP 132/86   Pulse 76   Temp 98.4 F (36.9 C) (Oral)   Ht 5' 4.25" (1.632 m)   Wt 200 lb (90.7 kg)   SpO2 98%   BMI 34.06 kg/m   Wt Readings from Last 3 Encounters:  01/31/17 203 lb (92.1 kg)  08/29/16 202 lb (91.6 kg)  08/09/16 202 lb (91.6 kg)    General: Appears their stated age, well developed, well nourished in NAD. Skin: Warm, dry and intact. No rashes, lesions or ulcerations noted. HEENT: Head: normal shape and size; Eyes: sclera white, no icterus, conjunctiva pink, PERRLA and EOMs intact; Ears: Tm's gray and intact, normal light reflex; Throat/Mouth: Teeth present, mucosa pink and moist, no exudate, lesions or ulcerations noted.  Neck: Neck supple, trachea midline. No masses, lumps or thyromegaly present.  Cardiovascular: Normal rate and rhythm. S1,S2 noted.  No murmur, rubs or gallops noted. No JVD or BLE edema. No carotid bruits noted. Pulmonary/Chest: Normal effort and positive vesicular breath sounds. No respiratory distress. No wheezes, rales or ronchi noted.  Abdomen: Soft and nontender. Normal bowel sounds. No distention or masses noted. Liver, spleen and kidneys non palpable. Musculoskeletal: Normal range of motion. Strength 5/5 BUE/BLE. No signs of joint swelling.  Neurological: Alert and oriented. Cranial nerves II-XII grossly intact. Coordination normal.  Psychiatric: Mood and affect normal. Behavior is normal. Judgment and thought content normal.   EKG:  BMET    Component Value Date/Time   NA 141 08/29/2016 1526   K 3.6 08/29/2016 1526   CL 104 08/29/2016 1526   CO2 29 08/29/2016 1526   GLUCOSE 131 (H) 08/29/2016 1526   BUN 13 08/29/2016 1526   CREATININE 0.88 08/29/2016 1526   CALCIUM 9.8 08/29/2016 1526   GFRNONAA >60 12/02/2015 0113   GFRAA >60 12/02/2015 0113    Lipid Panel  No results found for: CHOL, TRIG, HDL, CHOLHDL, VLDL, LDLCALC  CBC    Component Value  Date/Time   WBC 8.2 08/09/2016 1612   RBC 4.86 08/09/2016 1612   HGB 13.5 08/09/2016 1612   HCT 42.0 08/09/2016 1612   PLT 271.0 08/09/2016 1612   MCV 86.5 08/09/2016 1612   MCH 27.6 12/02/2015 0113   MCHC 32.1 08/09/2016 1612   RDW 14.1 08/09/2016 1612   LYMPHSABS 1.9 12/02/2015 0113   MONOABS 0.8 12/02/2015 0113   EOSABS 0.1 12/02/2015 0113   BASOSABS 0.0 12/02/2015 0113    Hgb A1C No results found for: HGBA1C    Assessment and Plan:   Medicare Annual Wellness Visit:  Diet: She does eat meat. She consumes fruits and veggies. She tries to avoid fried foods. She drinks mostly water. Physical activity: None Depression/mood screen: Negative Hearing: Intact to whispered voice Visual acuity: Grossly normal ADLs: Capable Fall risk: None Home safety: Good Cognitive evaluation: Intact to orientation,  naming, recall and repetition EOL planning: No adv directives, full code/ I agree  Preventative Medicine: Flu shot UTD. Prevnar today. Will get pneumovax in 1 year. She declines tetanus or shingles vaccine. She declines pap smear, mammogram. She declines colon screening but is agreeable to Cologuard. Encouraged her to consume a balanced diet and exercise regimen. Advised her to see an eye doctor and dentist annually.  Postmenopausal Bleeding:  Advised her she would need a pap, she will call back to schedule this appt  Next appointment: 1 year, Medicare Wellness Exam   Webb Silversmith, NP

## 2017-02-20 NOTE — Assessment & Plan Note (Signed)
Controlled on multiple meds CBC and CMET today

## 2017-02-20 NOTE — Addendum Note (Signed)
Addended by: Lurlean Nanny on: 02/20/2017 06:08 PM   Modules accepted: Orders

## 2017-02-21 LAB — COMPREHENSIVE METABOLIC PANEL
ALT: 13 U/L (ref 0–35)
AST: 21 U/L (ref 0–37)
Albumin: 4.4 g/dL (ref 3.5–5.2)
Alkaline Phosphatase: 70 U/L (ref 39–117)
BILIRUBIN TOTAL: 0.4 mg/dL (ref 0.2–1.2)
BUN: 11 mg/dL (ref 6–23)
CALCIUM: 10.5 mg/dL (ref 8.4–10.5)
CHLORIDE: 102 meq/L (ref 96–112)
CO2: 32 meq/L (ref 19–32)
Creatinine, Ser: 0.79 mg/dL (ref 0.40–1.20)
GFR: 92.56 mL/min (ref >60.00)
Glucose, Bld: 95 mg/dL (ref 70–99)
POTASSIUM: 3.6 meq/L (ref 3.5–5.1)
Sodium: 142 mEq/L (ref 135–145)
Total Protein: 8 g/dL (ref 6.0–8.3)

## 2017-02-21 LAB — HEPATITIS C ANTIBODY
HEP C AB: NONREACTIVE
SIGNAL TO CUT-OFF: 0.1 (ref <1.00)

## 2017-02-21 LAB — CBC
HEMATOCRIT: 43.3 % (ref 36.0–46.0)
HEMOGLOBIN: 14.2 g/dL (ref 12.0–15.0)
MCHC: 32.8 g/dL (ref 30.0–36.0)
MCV: 87.1 fl (ref 78.0–100.0)
PLATELETS: 263 10*3/uL (ref 150.0–400.0)
RBC: 4.97 Mil/uL (ref 3.87–5.11)
RDW: 13.8 % (ref 11.5–15.5)
WBC: 9 10*3/uL (ref 4.0–10.5)

## 2017-02-21 LAB — LIPID PANEL
CHOL/HDL RATIO: 5
Cholesterol: 213 mg/dL — ABNORMAL HIGH (ref 0–200)
HDL: 43.8 mg/dL (ref >39.00)
LDL Cholesterol: 145 mg/dL — ABNORMAL HIGH (ref 0–99)
NONHDL: 169.24
Triglycerides: 119 mg/dL (ref 0.0–149.0)
VLDL: 23.8 mg/dL (ref 0.0–40.0)

## 2017-02-21 LAB — HIV ANTIBODY (ROUTINE TESTING W REFLEX): HIV 1&2 Ab, 4th Generation: NONREACTIVE

## 2017-02-21 LAB — VITAMIN D 25 HYDROXY (VIT D DEFICIENCY, FRACTURES): VITD: 44.26 ng/mL (ref 30.00–100.00)

## 2017-03-02 MED ORDER — SPIRONOLACTONE 25 MG PO TABS: 25.0000 mg | ORAL_TABLET | Freq: Every day | ORAL | 10 refills | Status: DC

## 2017-03-02 MED ORDER — AMLODIPINE BESYLATE 10 MG PO TABS: 10.0000 mg | ORAL_TABLET | Freq: Every day | ORAL | 10 refills | Status: DC

## 2017-03-02 MED ORDER — LISINOPRIL 40 MG PO TABS: 40.0000 mg | ORAL_TABLET | Freq: Every day | ORAL | 10 refills | Status: DC

## 2017-03-02 NOTE — Addendum Note (Signed)
Addended by: Lurlean Nanny on: 03/02/2017 04:16 PM   Modules accepted: Orders

## 2017-03-07 ENCOUNTER — Ambulatory Visit (INDEPENDENT_AMBULATORY_CARE_PROVIDER_SITE_OTHER): Payer: Medicare Other | Admitting: Internal Medicine

## 2017-03-07 ENCOUNTER — Encounter: Payer: Self-pay | Admitting: Internal Medicine

## 2017-03-07 VITALS — BP 132/84 | HR 83 | Temp 98.3°F | Wt 202.0 lb

## 2017-03-07 DIAGNOSIS — Z124 Encounter for screening for malignant neoplasm of cervix: Secondary | ICD-10-CM

## 2017-03-07 DIAGNOSIS — N950 Postmenopausal bleeding: Secondary | ICD-10-CM

## 2017-03-07 NOTE — Progress Notes (Signed)
Subjective:    Patient ID: Teresa Fry, female    DOB: 07/12/1945, 71 y.o.   MRN: [id number]  HPI  Pt presents to the clinic today with c/o vaginal bleeding. This has been intermittent for months. She notices the blood after she wipes. She denies pelvic pain. She denies urinary complaints. She denies blood in her stool. She is postmenopausal and not sexually active.  Review of Systems      Past Medical History:  Diagnosis Date  . Gallstones   . Hypertension     Current Outpatient Medications  Medication Sig Dispense Refill  . amitriptyline (ELAVIL) 25 MG tablet Take 1 tablet (25 mg total) by mouth at bedtime. 30 tablet 1  . amLODipine (NORVASC) 10 MG tablet Take 1 tablet (10 mg total) by mouth daily. 30 tablet 10  . aspirin 81 MG tablet Take 81 mg by mouth daily.      Marland Kitchen lisinopril (PRINIVIL,ZESTRIL) 40 MG tablet Take 1 tablet (40 mg total) by mouth daily. 30 tablet 10  . Multiple Vitamin (MULTIVITAMIN) capsule Take 1 capsule by mouth daily.      . niacin 500 MG tablet Take 1,000 mg by mouth daily.    Marland Kitchen spironolactone (ALDACTONE) 25 MG tablet Take 1 tablet (25 mg total) by mouth daily. 30 tablet 10   No current facility-administered medications for this visit.     No Known Allergies  Family History  Problem Relation Age of Onset  . Heart disease Father   . Cancer Daughter        breast    Social History   Socioeconomic History  . Marital status: Married    Spouse name: Not on file  . Number of children: Not on file  . Years of education: Not on file  . Highest education level: Not on file  Social Needs  . Financial resource strain: Not on file  . Food insecurity - worry: Not on file  . Food insecurity - inability: Not on file  . Transportation needs - medical: Not on file  . Transportation needs - non-medical: Not on file  Occupational History  . Not on file  Tobacco Use  . Smoking status: Never Smoker  . Smokeless tobacco: Never Used  Substance and Sexual  Activity  . Alcohol use: No    Alcohol/week: 0.0 oz  . Drug use: No  . Sexual activity: No  Other Topics Concern  . Not on file  Social History Narrative  . Not on file     Constitutional: Denies fever, malaise, fatigue, headache or abrupt weight changes.  Gastrointestinal: Denies abdominal pain, bloating, constipation, diarrhea or blood in the stool.  GU: Pt reports vaginal bleeding. Denies urgency, frequency, pain with urination, burning sensation, blood in urine, odor or discharge.   No other specific complaints in a complete review of systems (except as listed in HPI above).  Objective:   Physical Exam  BP 132/84   Pulse 83   Temp 98.3 F (36.8 C) (Oral)   Wt 202 lb (91.6 kg)   SpO2 98%   BMI 34.40 kg/m  Wt Readings from Last 3 Encounters:  03/07/17 202 lb (91.6 kg)  02/20/17 200 lb (90.7 kg)  01/31/17 203 lb (92.1 kg)    General: Appears her stated age, well developed, well nourished in NAD. Abdomen: Soft and nontender. Normal bowel sounds. No distention or masses noted.  Pelvic: Normal female anatomy. Cervix friable, active bleeding noted from the surface of the cervix, not endocervical  area. No discharge noted. Adnexa non palpable.  BMET    Component Value Date/Time   NA 142 02/20/2017 1620   K 3.6 02/20/2017 1620   CL 102 02/20/2017 1620   CO2 32 02/20/2017 1620   GLUCOSE 95 02/20/2017 1620   BUN 11 02/20/2017 1620   CREATININE 0.79 02/20/2017 1620   CALCIUM 10.5 02/20/2017 1620   GFRNONAA >60 12/02/2015 0113   GFRAA >60 12/02/2015 0113    Lipid Panel     Component Value Date/Time   CHOL 213 (H) 02/20/2017 1620   TRIG 119.0 02/20/2017 1620   HDL 43.80 02/20/2017 1620   CHOLHDL 5 02/20/2017 1620   VLDL 23.8 02/20/2017 1620   LDLCALC 145 (H) 02/20/2017 1620    CBC    Component Value Date/Time   WBC 9.0 02/20/2017 1620   RBC 4.97 02/20/2017 1620   HGB 14.2 02/20/2017 1620   HCT 43.3 02/20/2017 1620   PLT 263.0 02/20/2017 1620   MCV 87.1  02/20/2017 1620   MCH 27.6 12/02/2015 0113   MCHC 32.8 02/20/2017 1620   RDW 13.8 02/20/2017 1620   LYMPHSABS 1.9 12/02/2015 0113   MONOABS 0.8 12/02/2015 0113   EOSABS 0.1 12/02/2015 0113   BASOSABS 0.0 12/02/2015 0113    Hgb A1C No results found for: HGBA1C          Assessment & Plan:   Postmenopausal Bleeding:  I think this is coming from vaginal atrophy and dryness Pap smear today If normal, but has continued bleeding, consider Premarin cream vs GYN referral for endometrial biopsy  Return precautions discussed Webb Silversmith, NP

## 2017-03-07 NOTE — Patient Instructions (Signed)
Postmenopausal Bleeding Postmenopausal bleeding is any bleeding after menopause. Menopause is when a woman's period stops. Any type of bleeding after menopause is concerning. It should be checked by your doctor. Any treatment will depend on the cause. Follow these instructions at home: Watch your condition for any changes.  Avoid the use of tampons and douches as told by your doctor.  Change your pads often.  Get regular pelvic exams and Pap tests.  Keep all appointments for tests as told by your doctor.  Contact a doctor if:  Your bleeding lasts for more than 1 week.  You have belly (abdominal) pain.  You have bleeding after sex (intercourse). Get help right away if:  You have a fever, chills, a headache, dizziness, muscle aches, and bleeding.  You have strong pain with bleeding.  You have clumps of blood (blood clots) coming from your vagina.  You have bleeding and need more than 1 pad an hour.  You feel like you are going to pass out (faint). This information is not intended to replace advice given to you by your health care provider. Make sure you discuss any questions you have with your health care provider. Document Released: 12/28/2007 Document Revised: 08/26/2015 Document Reviewed: 10/17/2012 Elsevier Interactive Patient Education  2017 Elsevier Inc.  

## 2017-03-08 ENCOUNTER — Other Ambulatory Visit (HOSPITAL_COMMUNITY)
Admission: RE | Admit: 2017-03-08 | Discharge: 2017-03-08 | Disposition: A | Discharge status: Home / Self Care | Payer: Medicare Other | Source: Ambulatory Visit | Attending: Internal Medicine | Admitting: Internal Medicine

## 2017-03-08 DIAGNOSIS — Z124 Encounter for screening for malignant neoplasm of cervix: Secondary | ICD-10-CM | POA: Insufficient documentation

## 2017-03-08 DIAGNOSIS — N950 Postmenopausal bleeding: Secondary | ICD-10-CM | POA: Insufficient documentation

## 2017-03-08 NOTE — Addendum Note (Signed)
Addended by: Lurlean Nanny on: 03/08/2017 08:41 AM   Modules accepted: Orders

## 2017-03-10 LAB — CYTOLOGY - PAP
DIAGNOSIS: NEGATIVE
HPV (WINDOPATH): NOT DETECTED

## 2017-11-30 DIAGNOSIS — H52223 Regular astigmatism, bilateral: Secondary | ICD-10-CM | POA: Diagnosis not present

## 2017-11-30 DIAGNOSIS — H04123 Dry eye syndrome of bilateral lacrimal glands: Secondary | ICD-10-CM | POA: Diagnosis not present

## 2017-11-30 DIAGNOSIS — H524 Presbyopia: Secondary | ICD-10-CM | POA: Diagnosis not present

## 2017-11-30 DIAGNOSIS — H2513 Age-related nuclear cataract, bilateral: Secondary | ICD-10-CM | POA: Diagnosis not present

## 2017-11-30 DIAGNOSIS — H5213 Myopia, bilateral: Secondary | ICD-10-CM | POA: Diagnosis not present

## 2017-12-17 ENCOUNTER — Encounter: Payer: Self-pay | Admitting: Family Medicine

## 2017-12-17 ENCOUNTER — Ambulatory Visit (INDEPENDENT_AMBULATORY_CARE_PROVIDER_SITE_OTHER): Payer: Medicare Other | Admitting: Family Medicine

## 2017-12-17 ENCOUNTER — Encounter: Payer: Self-pay | Admitting: Emergency Medicine

## 2017-12-17 VITALS — BP 162/92 | HR 85 | Temp 98.3°F | Ht 64.25 in | Wt 204.8 lb

## 2017-12-17 DIAGNOSIS — I10 Essential (primary) hypertension: Secondary | ICD-10-CM

## 2017-12-17 DIAGNOSIS — M25511 Pain in right shoulder: Secondary | ICD-10-CM | POA: Diagnosis not present

## 2017-12-17 DIAGNOSIS — G8929 Other chronic pain: Secondary | ICD-10-CM

## 2017-12-17 DIAGNOSIS — E669 Obesity, unspecified: Secondary | ICD-10-CM | POA: Insufficient documentation

## 2017-12-17 DIAGNOSIS — E785 Hyperlipidemia, unspecified: Secondary | ICD-10-CM | POA: Insufficient documentation

## 2017-12-17 NOTE — Patient Instructions (Signed)
Please follow up with Dr. Lorelei Pont- sports medicine physician in the office  Can take 1-2 Alleve every 12 hours as needed for pain   Shoulder Range of Motion Exercises Shoulder range of motion (ROM) exercises are designed to keep the shoulder moving freely. They are often recommended for people who have shoulder pain. Phase 1 exercises When you are able, do this exercise 5-6 days per week, or as told by your health care provider. Work toward doing 2 sets of 10 swings. Pendulum Exercise How To Do This Exercise Lying Down 1. Lie face-down on a bed with your abdomen close to the side of the bed. 2. Let your arm hang over the side of the bed. 3. Relax your shoulder, arm, and hand. 4. Slowly and gently swing your arm forward and back. Do not use your neck muscles to swing your arm. They should be relaxed. If you are struggling to swing your arm, have someone gently swing it for you. When you do this exercise for the first time, swing your arm at a 15 degree angle for 15 seconds, or swing your arm 10 times. As pain lessens over time, increase the angle of the swing to 30-45 degrees. 5. Repeat steps 1-4 with the other arm.  How To Do This Exercise While Standing 1. Stand next to a sturdy chair or table and hold on to it with your hand. 1. Bend forward at the waist. 2. Bend your knees slightly. 3. Relax your other arm and let it hang limp. 4. Relax the shoulder blade of the arm that is hanging and let it drop. 5. While keeping your shoulder relaxed, use body motion to swing your arm in small circles. The first time you do this exercise, swing your arm for about 30 seconds or 10 times. When you do it next time, swing your arm for a little longer. 6. Stand up tall and relax. 7. Repeat steps 1-7, this time changing the direction of the circles. 2. Repeat steps 1-8 with the other arm.  Phase 2 exercises Do these exercises 3-4 times per day on 5-6 days per week or as told by your health care provider.  Work toward holding the stretch for 20 seconds. Stretching Exercise 1 1. Lift your arm straight out in front of you. 2. Bend your arm 90 degrees at the elbow (right angle) so your forearm goes across your body and looks like the letter "L." 3. Use your other arm to gently pull the elbow forward and across your body. 4. Repeat steps 1-3 with the other arm. Stretching Exercise 2 You will need a towel or rope for this exercise. 1. Bend one arm behind your back with the palm facing outward. 2. Hold a towel with your other hand. 3. Reach the arm that holds the towel above your head, and bend that arm at the elbow. Your wrist should be behind your neck. 4. Use your free hand to grab the free end of the towel. 5. With the higher hand, gently pull the towel up behind you. 6. With the lower hand, pull the towel down behind you. 7. Repeat steps 1-6 with the other arm.  Phase 3 exercises Do each of these exercises at four different times of day (sessions) every day or as told by your health care provider. To begin with, repeat each exercise 5 times (repetitions). Work toward doing 3 sets of 12 repetitions or as told by your health care provider. Strengthening Exercise 1 You will need  a light weight for this activity. As you grow stronger, you may use a heavier weight. 1. Standing with a weight in your hand, lift your arm straight out to the side until it is at the same height as your shoulder. 2. Bend your arm at 90 degrees so that your fingers are pointing to the ceiling. 3. Slowly raise your hand until your arm is straight up in the air. 4. Repeat steps 1-3 with the other arm.  Strengthening Exercise 2 You will need a light weight for this activity. As you grow stronger, you may use a heavier weight. 1. Standing with a weight in your hand, gradually move your straight arm in an arc, starting at your side, then out in front of you, then straight up over your head. 2. Gradually move your other arm  in an arc, starting at your side, then out in front of you, then straight up over your head. 3. Repeat steps 1-2 with the other arm.  Strengthening Exercise 3 You will need an elastic band for this activity. As you grow stronger, gradually increase the size of the bands or increase the number of bands that you use at one time. 1. While standing, hold an elastic band in one hand and raise that arm up in the air. 2. With your other hand, pull down the band until that hand is by your side. 3. Repeat steps 1-2 with the other arm.  This information is not intended to replace advice given to you by your health care provider. Make sure you discuss any questions you have with your health care provider. Document Released: 12/17/2002 Document Revised: 11/14/2015 Document Reviewed: 03/16/2014 Elsevier Interactive Patient Education  [employer].

## 2017-12-17 NOTE — Progress Notes (Signed)
   Subjective:    Patient ID: Teresa Fry, female    DOB: 06-12-1945, 72 y.o.   MRN: [id number]  HPI This is a 72 yo female who presents today with right shoulder pain since a fall that occurred 1/18.  Pain has been off and on, last night had increased pain. 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 Does not recall any recent trauma or overuse. Took two Alleve yesterday afternoon with some relief, but pain worsened over night. Pain feels like it was "coming from the bone", can't move her arm much, especially backward. Some improvement in range of motion since this morning. Has been seen several times for shoulder pain, has been doing exercises.   HTN- patient reports that she is taking all prescribed medications.  Past Medical History:  Diagnosis Date  . Gallstones   . Hypertension    Past Surgical History:  Procedure Laterality Date  . CHOLECYSTECTOMY  02/03/11   Family History  Problem Relation Age of Onset  . Heart disease Father   . Cancer Daughter        breast   Social History   Tobacco Use  . Smoking status: Never Smoker  . Smokeless tobacco: Never Used  Substance Use Topics  . Alcohol use: No    Alcohol/week: 0.0 standard drinks  . Drug use: No      Review of Systems Per HPI    Objective:   Physical Exam  Constitutional: She is oriented to person, place, and time. She appears well-developed and well-nourished. No distress.  HENT:  Head: Normocephalic.  Eyes: Conjunctivae are normal.  Cardiovascular: Normal rate.  Pulmonary/Chest: Effort normal.  Musculoskeletal:       Right shoulder: She exhibits decreased range of motion (slightly decreased abduction), tenderness (global from mid scapula to anterior ) and bony tenderness. She exhibits no swelling.  Able to slowly extend arm fully. Pain with flexion. UE strength 5/5.   Neurological: She is alert and oriented to person, place, and time.  Skin: Skin is warm and dry. She is not diaphoretic.    Psychiatric: She has a normal mood and affect. Her behavior is normal. Judgment and thought content normal.  Vitals reviewed.     BP (!) 162/92 (BP Location: Left Arm, Patient Position: Sitting, Cuff Size: Large)   Pulse 85   Temp 98.3 F (36.8 C) (Oral)   Ht 5' 4.25" (1.632 m)   Wt 204 lb 12.8 oz (92.9 kg)   SpO2 97%   BMI 34.88 kg/m  Wt Readings from Last 3 Encounters:  12/17/17 204 lb 12.8 oz (92.9 kg)  03/07/17 202 lb (91.6 kg)  02/20/17 200 lb (90.7 kg)   BP Readings from Last 3 Encounters:  12/17/17 (!) 162/92  03/07/17 132/84  02/20/17 132/86       Assessment & Plan:  1. Chronic right shoulder pain - have suggested she follow up with Dr. Lorelei Pont (Sports medicine) and discussed sparingly using NSAIDs and can alternate with acetaminophen, use heat prn, gentle ROM/stretching exercises (provided)  2. Essential hypertension - mildly elevated today, ? Related to pain - she is seeing Dr. Lorelei Pont in 2 days, BP will be rechecked at that time   Clarene Reamer, FNP-BC  [facility], [place]  12/17/2017 3:37 PM

## 2017-12-18 ENCOUNTER — Telehealth: Payer: Self-pay

## 2017-12-18 NOTE — Telephone Encounter (Signed)
Please Advise

## 2017-12-18 NOTE — Telephone Encounter (Signed)
Please extend her out of work note.

## 2017-12-18 NOTE — Telephone Encounter (Signed)
Pt called back and stated that she would like to pick up this note by 8 am .  She stated she needs to take it to work with her in the morning.

## 2017-12-18 NOTE — Telephone Encounter (Signed)
Copied from [place] [id number]. Topic: Quick Communication - See Telephone Encounter >> Dec 18, 2017 10:29 AM Gardiner Ramus wrote: CRM for notification. See Telephone encounter for: 12/18/17. Pt called and stated that she was seen in office with gessner on 12/17/17 and would like her note extended for today and return back work on the 18 th. Please advise

## 2017-12-19 ENCOUNTER — Encounter: Payer: Self-pay | Admitting: Family Medicine

## 2017-12-19 ENCOUNTER — Ambulatory Visit (INDEPENDENT_AMBULATORY_CARE_PROVIDER_SITE_OTHER): Payer: Medicare Other | Admitting: Family Medicine

## 2017-12-19 ENCOUNTER — Encounter: Payer: Self-pay | Admitting: *Deleted

## 2017-12-19 VITALS — BP 151/81 | HR 71 | Temp 98.3°F | Ht 64.25 in | Wt 203.0 lb

## 2017-12-19 DIAGNOSIS — M7501 Adhesive capsulitis of right shoulder: Secondary | ICD-10-CM | POA: Diagnosis not present

## 2017-12-19 MED ORDER — METHYLPREDNISOLONE ACETATE 40 MG/ML IJ SUSP: 40.0000 mg | Freq: Once | INTRAMUSCULAR | Status: DC

## 2017-12-19 MED ORDER — METHYLPREDNISOLONE ACETATE 40 MG/ML IJ SUSP
80.0000 mg | Freq: Once | INTRAMUSCULAR | Status: AC
  Administered 2017-12-19: 80 mg via INTRA_ARTICULAR

## 2017-12-19 NOTE — Telephone Encounter (Signed)
Pt stopped by to pick up note.  Pt has appointment today with dr copland and will pick note up then

## 2017-12-19 NOTE — Telephone Encounter (Signed)
Letter given to [employer]

## 2017-12-19 NOTE — Telephone Encounter (Signed)
Per previous note, please extend her out of work note until 12/19/17. She has an appointment with Dr. Lorelei Pont today and will pick up at that time.

## 2017-12-19 NOTE — Progress Notes (Signed)
Dr. Frederico Hamman T. Thanvi Blincoe, MD, [facility] [place], [address] Phone: [phone] Fax: [phone]  12/19/2017  Patient: Teresa Fry, MRN: [id number], DOB: [birth year], 72 y.o.  Primary Physician:  Jearld Fenton, NP   Chief Complaint  Patient presents with  . Shoulder Pain    Right   Subjective:   Teresa Fry is a 72 y.o. very pleasant female patient who presents with the following: shoulder pain  The patient noted above presents with shoulder pain that has been ongoing - not entirely sure when worsened and ROM worsened, but she did have an accident in 04/2016 where she fell on her shoulder.  The patient denies neck pain or radicular symptoms. No shoulder blade pain Denies dislocation, subluxation, separation of the shoulder. The patient does complain of pain with flexion, abduction, and terminal motion.  Significant restriction of motion. she describes a deep ache around the shoulder, and sometimes it will wake the patient up at night.  Fell down the stairs, then fell and hit her shoulder dead on. No neck, numbness or tingling.   Medications Tried: tylenol and nsaids Ice or Heat: minimal help Tried PT: No  Prior shoulder Injury: No Prior surgery: No Prior fracture: No  Past Medical History, Surgical History, Social History, Family History, Medications, and allergies reviewed and updated if relevant.   Patient Active Problem List   Diagnosis Date Noted  . Hyperlipidemia 12/17/2017  . Obesity 12/17/2017  . Essential hypertension 03/10/2015    Past Medical History:  Diagnosis Date  . Gallstones   . Hypertension     Past Surgical History:  Procedure Laterality Date  . CHOLECYSTECTOMY  02/03/11    Social History   Socioeconomic History  . Marital status: Married    Spouse name: Not on file  . Number of children: Not on file  . Years of education: Not on file  . Highest education level: Not on file    Occupational History  . Not on file  Social Needs  . Financial resource strain: Not on file  . Food insecurity:    Worry: Not on file    Inability: Not on file  . Transportation needs:    Medical: Not on file    Non-medical: Not on file  Tobacco Use  . Smoking status: Never Smoker  . Smokeless tobacco: Never Used  Substance and Sexual Activity  . Alcohol use: No    Alcohol/week: 0.0 standard drinks  . Drug use: No  . Sexual activity: Never  Lifestyle  . Physical activity:    Days per week: Not on file    Minutes per session: Not on file  . Stress: Not on file  Relationships  . Social connections:    Talks on phone: Not on file    Gets together: Not on file    Attends religious service: Not on file    Active member of club or organization: Not on file    Attends meetings of clubs or organizations: Not on file    Relationship status: Not on file  . Intimate partner violence:    Fear of current or ex partner: Not on file    Emotionally abused: Not on file    Physically abused: Not on file    Forced sexual activity: Not on file  Other Topics Concern  . Not on file  Social History Narrative  . Not on file    Family History  Problem Relation  Age of Onset  . Heart disease Father   . Cancer Daughter        breast    No Known Allergies  Medication list reviewed and updated in full in [place].  GEN: No fevers, chills. Nontoxic. Primarily MSK c/o today. MSK: Detailed in the HPI GI: tolerating PO intake without difficulty Neuro: No numbness, parasthesias, or tingling associated. Otherwise the pertinent positives of the ROS are noted above.    Objective:   Blood pressure (!) 151/81, pulse 71, temperature 98.3 F (36.8 C), temperature source Oral, height 5' 4.25" (1.632 m), weight 203 lb (92.1 kg).   GEN: WDWN, NAD, Non-toxic, Alert & Oriented x 3 HEENT: Atraumatic, Normocephalic.  Ears and Nose: No external deformity. EXTR: No  clubbing/cyanosis/edema NEURO: Normal gait.  PSYCH: Normally interactive. Conversant. Not depressed or anxious appearing.  Calm demeanor.   Shoulder: R and L Inspection: No muscle wasting or winging Ecchymosis/edema: neg  AC joint, scapula, clavicle: NT Cervical spine: NT, full ROM Spurling's: neg ABNORMAL SIDE TESTED: r UNLESS OTHERWISE NOTED, THE CONTRALATERAL SIDE HAS FULL RANGE OF MOTION. Abduction: 5/5, LIMITED TO 140 DEGREES Flexion: 5/5, LIMITED TO 145 DEGNO ROM  IR, lift-off: 5/5. TESTED AT 90 DEGREES OF ABDUCTION, LIMITED TO 0 DEGREES ER at neutral:  5/5, TESTED AT 90 DEGREES OF ABDUCTION, LIMITED TO 45 DEGREES AC crossover and compression: PAIN Drop Test: neg Empty Can: neg Supraspinatus insertion: NT Bicipital groove: NT ALL OTHER SPECIAL TESTING EQUIVOCAL GIVEN LOSS OF MOTION C5-T1 intact Sensation intact Grip 5/5   Assessment and Plan:   Adhesive capsulitis of right shoulder - Plan: methylPREDNISolone acetate (DEPO-MEDROL) injection 40 mg, methylPREDNISolone acetate (DEPO-MEDROL) injection 80 mg  >25 minutes spent in face to face time with patient, >50% spent in counselling or coordination of care   Patient was given a systematic ROM protocol from [facility] or [facility] to be done daily. Emphasized importance of adherence, help of PT, daily HEP.  The average length of total symptoms is 12-18 months going through 3 different phases in the freezing and thawing process. Reviewed all with patient.   Tylenol or NSAID of choice prn for pain relief Intraarticular shoulder injections discussed with patient, which have good evidence for accelerating the thawing phase.  Patient will be sent for formal PT for aggressive frozen shoulder ROM - declined for now Will need RTC str and scapular stabilization to fix underlying mechanics.  Intrarticular Shoulder Injection, R 12/19/2017 procedure date Verbal consent was obtained from the patient. Risks including infection explained and  contrasted with benefits and alternatives. Patient prepped with Chloraprep and Ethyl Chloride used for anesthesia. An intraarticular shoulder injection was performed using the posterior approach; needle placed into joint capsule without difficulty. The patient tolerated the procedure well and had decreased pain post injection. No complications. Injection: 8 cc of Lidocaine 1% and 2 mL Depo-Medrol 40 mg. Needle: 21 gauge, 2 inch   Follow-up: Return in about 2 months (around 02/18/2018).  Meds ordered this encounter  Medications  . methylPREDNISolone acetate (DEPO-MEDROL) injection 40 mg  . methylPREDNISolone acetate (DEPO-MEDROL) injection 80 mg   Signed,  Mathieu Schloemer T. Eban Weick, MD   Patient's Medications  New Prescriptions   No medications on file  Previous Medications   AMLODIPINE (NORVASC) 10 MG TABLET    Take 1 tablet (10 mg total) by mouth daily.   ASPIRIN 81 MG TABLET    Take 81 mg by mouth daily.     LISINOPRIL (PRINIVIL,ZESTRIL) 40 MG TABLET  Take 1 tablet (40 mg total) by mouth daily.   MULTIPLE VITAMIN (MULTIVITAMIN) CAPSULE    Take 1 capsule by mouth daily.     NIACIN 500 MG TABLET    Take 1,000 mg by mouth daily.   SPIRONOLACTONE (ALDACTONE) 25 MG TABLET    Take 1 tablet (25 mg total) by mouth daily.  Modified Medications   No medications on file  Discontinued Medications   No medications on file

## 2018-01-03 DIAGNOSIS — H2511 Age-related nuclear cataract, right eye: Secondary | ICD-10-CM | POA: Diagnosis not present

## 2018-01-03 DIAGNOSIS — H25811 Combined forms of age-related cataract, right eye: Secondary | ICD-10-CM | POA: Diagnosis not present

## 2018-02-14 ENCOUNTER — Telehealth: Payer: Self-pay

## 2018-02-14 NOTE — Telephone Encounter (Signed)
Called several numbers in the chart to try and reach patient, was not able to leave a voicemail message on them. Letter mailed. Need to schedule Annual Wellness visit with Webb Silversmith, NP last one was 02/20/17.

## 2018-02-18 ENCOUNTER — Ambulatory Visit: Payer: Medicare Other | Admitting: Family Medicine

## 2018-02-19 ENCOUNTER — Other Ambulatory Visit: Payer: Self-pay

## 2018-03-07 ENCOUNTER — Ambulatory Visit: Payer: Medicare Other | Admitting: Family Medicine

## 2018-03-11 ENCOUNTER — Ambulatory Visit: Payer: Medicare Other | Admitting: Family Medicine

## 2018-03-12 DIAGNOSIS — Z23 Encounter for immunization: Secondary | ICD-10-CM | POA: Diagnosis not present

## 2018-03-14 ENCOUNTER — Ambulatory Visit: Payer: Medicare Other | Admitting: Internal Medicine

## 2018-03-17 ENCOUNTER — Other Ambulatory Visit: Payer: Self-pay | Admitting: Internal Medicine

## 2018-03-17 DIAGNOSIS — I10 Essential (primary) hypertension: Secondary | ICD-10-CM

## 2018-03-25 ENCOUNTER — Other Ambulatory Visit: Payer: Self-pay | Admitting: Internal Medicine

## 2018-03-25 DIAGNOSIS — I10 Essential (primary) hypertension: Secondary | ICD-10-CM

## 2018-04-02 NOTE — Progress Notes (Signed)
Dr. Frederico Hamman T. Jalin Erpelding, MD, [facility] [place], [address] Phone: [phone] Fax: [phone]  04/04/2018  Patient: Teresa Fry, MRN: [id number], DOB: 20-Mar-1946, 72 y.o.  Primary Physician:  Jearld Fenton, NP   Chief Complaint  Patient presents with  . Follow-up    Right Shoulder   Subjective:   Teresa Fry is a 72 y.o. very pleasant female patient who presents with the following:  Patient is here for follow-up on right-sided adhesive capsulitis.  I last saw her in September 2019, at that point I did an intra-articular shoulder injection, started her on formal physical therapy, and a home exercise program.  She is really doing quite well.  Range of motion has improved quite a bit, and her pain level is down greatly.  She still has some pain at nighttime and some occasional twinges of pain, but it is doing much better than before and she has been very compliant with her home rehab program.  Past Medical History, Surgical History, Social History, Family History, Problem List, Medications, and Allergies have been reviewed and updated if relevant.  Patient Active Problem List   Diagnosis Date Noted  . Hyperlipidemia 12/17/2017  . Obesity 12/17/2017  . Essential hypertension 03/10/2015    Past Medical History:  Diagnosis Date  . Gallstones   . Hypertension     Past Surgical History:  Procedure Laterality Date  . CHOLECYSTECTOMY  02/03/11    Social History   Socioeconomic History  . Marital status: Married    Spouse name: Not on file  . Number of children: Not on file  . Years of education: Not on file  . Highest education level: Not on file  Occupational History  . Not on file  Social Needs  . Financial resource strain: Not on file  . Food insecurity:    Worry: Not on file    Inability: Not on file  . Transportation needs:    Medical: Not on file    Non-medical: Not on file  Tobacco Use  .  Smoking status: Never Smoker  . Smokeless tobacco: Never Used  Substance and Sexual Activity  . Alcohol use: No    Alcohol/week: 0.0 standard drinks  . Drug use: No  . Sexual activity: Never  Lifestyle  . Physical activity:    Days per week: Not on file    Minutes per session: Not on file  . Stress: Not on file  Relationships  . Social connections:    Talks on phone: Not on file    Gets together: Not on file    Attends religious service: Not on file    Active member of club or organization: Not on file    Attends meetings of clubs or organizations: Not on file    Relationship status: Not on file  . Intimate partner violence:    Fear of current or ex partner: Not on file    Emotionally abused: Not on file    Physically abused: Not on file    Forced sexual activity: Not on file  Other Topics Concern  . Not on file  Social History Narrative  . Not on file    Family History  Problem Relation Age of Onset  . Heart disease Father   . Cancer Daughter        breast    No Known Allergies  Medication list reviewed and updated in full in [place].  GEN:  No fevers, chills. Nontoxic. Primarily MSK c/o today. MSK: Detailed in the HPI GI: tolerating PO intake without difficulty Neuro: No numbness, parasthesias, or tingling associated. Otherwise the pertinent positives of the ROS are noted above.   Objective:   BP 140/80   Pulse 81   Temp 98.4 F (36.9 C) (Oral)   Ht 5' 4.25" (1.632 m)   Wt 202 lb 12 oz (92 kg)   BMI 34.53 kg/m    GEN: WDWN, NAD, Non-toxic, Alert & Oriented x 3 HEENT: Atraumatic, Normocephalic.  Ears and Nose: No external deformity. EXTR: No clubbing/cyanosis/edema NEURO: Normal gait.  PSYCH: Normally interactive. Conversant. Not depressed or anxious appearing.  Calm demeanor.    Nontender in all bony anatomy. Nontender at the [facility] joint and the bicipital groove. Abduction, flexion are equal compared to the left side. External rotation and  internal rotation lack about 5 to 7 degrees compared to the left side.  Abduction strength is 4+/5, but all other strength and planes of motion is 5/5.  Neurovascularly intact.  Teresa Fry is negative as well as Neer testing.  Radiology: No results found.  Assessment and Plan:   Adhesive capsulitis of right shoulder  She is really done great intervally compared to the last time I saw her.  She is well into the following phase, and I think that her shoulder will continue to progress and range of motion and pain level should likely return to baseline levels given a little bit more time.  Encouraged her, and I would continue to do some daily stretching of the shoulder capsule.  Follow-up: No follow-ups on file.   Signed,  Maud Deed. Angelica Wix, MD   Outpatient Encounter Medications as of 04/04/2018  Medication Sig  . amLODipine (NORVASC) 10 MG tablet Take 1 tablet (10 mg total) by mouth daily. MUST SCHEDULE ANNUAL PHYSICAL  . aspirin 81 MG tablet Take 81 mg by mouth daily.    Marland Kitchen lisinopril (PRINIVIL,ZESTRIL) 40 MG tablet Take 1 tablet (40 mg total) by mouth daily. MUST SCHEDULE ANNUAL EXAM  . Multiple Vitamin (MULTIVITAMIN) capsule Take 1 capsule by mouth daily.    . niacin 500 MG tablet Take 1,000 mg by mouth daily.  Marland Kitchen spironolactone (ALDACTONE) 25 MG tablet Take 1 tablet (25 mg total) by mouth daily. MUST SCHEDULE ANNUAL PHYSICAL   Facility-Administered Encounter Medications as of 04/04/2018  Medication  . methylPREDNISolone acetate (DEPO-MEDROL) injection 40 mg

## 2018-04-04 ENCOUNTER — Encounter: Payer: Self-pay | Admitting: Family Medicine

## 2018-04-04 ENCOUNTER — Ambulatory Visit (INDEPENDENT_AMBULATORY_CARE_PROVIDER_SITE_OTHER): Payer: Medicare Other | Admitting: Family Medicine

## 2018-04-04 VITALS — BP 140/80 | HR 81 | Temp 98.4°F | Ht 64.25 in | Wt 202.8 lb

## 2018-04-04 DIAGNOSIS — M7501 Adhesive capsulitis of right shoulder: Secondary | ICD-10-CM

## 2018-04-29 ENCOUNTER — Other Ambulatory Visit: Payer: Self-pay | Admitting: Internal Medicine

## 2018-04-29 DIAGNOSIS — I10 Essential (primary) hypertension: Secondary | ICD-10-CM

## 2018-06-02 ENCOUNTER — Other Ambulatory Visit: Payer: Self-pay | Admitting: Internal Medicine

## 2018-06-02 DIAGNOSIS — I10 Essential (primary) hypertension: Secondary | ICD-10-CM

## 2018-06-19 ENCOUNTER — Ambulatory Visit: Payer: Self-pay | Admitting: Internal Medicine

## 2018-06-19 ENCOUNTER — Ambulatory Visit (INDEPENDENT_AMBULATORY_CARE_PROVIDER_SITE_OTHER): Payer: Medicare Other | Admitting: Internal Medicine

## 2018-06-19 ENCOUNTER — Other Ambulatory Visit: Payer: Self-pay

## 2018-06-19 ENCOUNTER — Encounter: Payer: Self-pay | Admitting: Internal Medicine

## 2018-06-19 VITALS — BP 142/86 | HR 77 | Temp 97.8°F | Wt 200.0 lb

## 2018-06-19 DIAGNOSIS — R42 Dizziness and giddiness: Secondary | ICD-10-CM

## 2018-06-19 MED ORDER — MECLIZINE HCL 25 MG PO TABS: 25.0000 mg | ORAL_TABLET | Freq: Three times a day (TID) | ORAL | 0 refills | Status: DC | PRN

## 2018-06-19 NOTE — Telephone Encounter (Signed)
I spoke with Threasa Beards CMA about the 3 pm being a 15' appt and pt has dizziness and is 73. Melanie changed appt to 30' at 2:30. FYI to Avie Echevaria NP.

## 2018-06-19 NOTE — Progress Notes (Signed)
Subjective:    Patient ID: Teresa Fry, female    DOB: 03-17-1946, 73 y.o.   MRN: [id number]  HPI  Pt presents to the clinic today with c/o dizziness. She reports this started 4 days ago. She describes the dizziness as a sense of imbalance and like the room is spinning. She reports associated nausea, but denies vomiting. She denies runny nose, nasal congestion, ear pain or sore throat. She denies chest pain, chest tightness or SOB. She has taken Ibuprofen with minimal relief. She has had Vertigo in the past and reports this feels the same.   Review of Systems      Past Medical History:  Diagnosis Date   Gallstones    Hypertension     Current Outpatient Medications  Medication Sig Dispense Refill   amLODipine (NORVASC) 10 MG tablet TAKE 1 TABLET BY MOUTH ONCE DAILY ( MUST SCHEDULE ANNUAL PHYSICAL ) 30 tablet 0   aspirin 81 MG tablet Take 81 mg by mouth daily.       lisinopril (PRINIVIL,ZESTRIL) 40 MG tablet TAKE 1 TABLET BY MOUTH  DAILY ( MUST SCHEDULE ANNUAL EXAM ) 30 tablet 0   Multiple Vitamin (MULTIVITAMIN) capsule Take 1 capsule by mouth daily.       niacin 500 MG tablet Take 1,000 mg by mouth daily.     spironolactone (ALDACTONE) 25 MG tablet TAKE 1 TABLET BY MOUTH DAILY ( MUST SCHEDULE ANNUAL PHYSICAL FOR MORE REFILLS) 30 tablet 0   Current Facility-Administered Medications  Medication Dose Route Frequency Provider Last Rate Last Dose   methylPREDNISolone acetate (DEPO-MEDROL) injection 40 mg  40 mg Intramuscular Once Copland, Spencer, MD        No Known Allergies  Family History  Problem Relation Age of Onset   Heart disease Father    Cancer Daughter        breast    Social History   Socioeconomic History   Marital status: Married    Spouse name: Not on file   Number of children: Not on file   Years of education: Not on file   Highest education level: Not on file  Occupational History   Not on file  Social Needs   Financial resource strain:  Not on file   Food insecurity:    Worry: Not on file    Inability: Not on file   Transportation needs:    Medical: Not on file    Non-medical: Not on file  Tobacco Use   Smoking status: Never Smoker   Smokeless tobacco: Never Used  Substance and Sexual Activity   Alcohol use: No    Alcohol/week: 0.0 standard drinks   Drug use: No   Sexual activity: Never  Lifestyle   Physical activity:    Days per week: Not on file    Minutes per session: Not on file   Stress: Not on file  Relationships   Social connections:    Talks on phone: Not on file    Gets together: Not on file    Attends religious service: Not on file    Active member of club or organization: Not on file    Attends meetings of clubs or organizations: Not on file    Relationship status: Not on file   Intimate partner violence:    Fear of current or ex partner: Not on file    Emotionally abused: Not on file    Physically abused: Not on file    Forced sexual activity: Not on file  Other Topics Concern   Not on file  Social History Narrative   Not on file     Constitutional: Denies fever, malaise, fatigue, headache or abrupt weight changes.  HEENT: Denies eye pain, eye redness, ear pain, ringing in the ears, wax buildup, runny nose, nasal congestion, bloody nose, or sore throat. Respiratory: Denies difficulty breathing, shortness of breath, cough or sputum production.   Cardiovascular: Denies chest pain, chest tightness, palpitations or swelling in the hands or feet.  Gastrointestinal: Denies abdominal pain, bloating, constipation, diarrhea or blood in the stool.  Neurological: Pt reports dizziness. Denies difficulty with memory, difficulty with speech or problems with balance and coordination.    No other specific complaints in a complete review of systems (except as listed in HPI above).  Objective:   Physical Exam   BP (!) 142/86    Pulse 77    Temp 97.8 F (36.6 C) (Oral)    Wt 200 lb (90.7  kg)    SpO2 97%    BMI 34.06 kg/m  Wt Readings from Last 3 Encounters:  06/19/18 200 lb (90.7 kg)  04/04/18 202 lb 12 oz (92 kg)  12/19/17 203 lb (92.1 kg)    General: Appears her stated age, well developed, well nourished in NAD. HEENT: Head: normal shape and size; Eyes: sclera white, no icterus, conjunctiva pink, PERRLA and EOMs intact; Ears: Tm's gray and intact, normal light reflex;  Cardiovascular: Normal rate and rhythm. S1,S2 noted.  No murmur, rubs or gallops noted.  Pulmonary/Chest: Normal effort and positive vesicular breath sounds. No respiratory distress. No wheezes, rales or ronchi noted.  Neurological: Alert and oriented. Coordination normal.   BMET    Component Value Date/Time   NA 142 02/20/2017 1620   K 3.6 02/20/2017 1620   CL 102 02/20/2017 1620   CO2 32 02/20/2017 1620   GLUCOSE 95 02/20/2017 1620   BUN 11 02/20/2017 1620   CREATININE 0.79 02/20/2017 1620   CALCIUM 10.5 02/20/2017 1620   GFRNONAA >60 12/02/2015 0113   GFRAA >60 12/02/2015 0113    Lipid Panel     Component Value Date/Time   CHOL 213 (H) 02/20/2017 1620   TRIG 119.0 02/20/2017 1620   HDL 43.80 02/20/2017 1620   CHOLHDL 5 02/20/2017 1620   VLDL 23.8 02/20/2017 1620   LDLCALC 145 (H) 02/20/2017 1620    CBC    Component Value Date/Time   WBC 9.0 02/20/2017 1620   RBC 4.97 02/20/2017 1620   HGB 14.2 02/20/2017 1620   HCT 43.3 02/20/2017 1620   PLT 263.0 02/20/2017 1620   MCV 87.1 02/20/2017 1620   MCH 27.6 12/02/2015 0113   MCHC 32.8 02/20/2017 1620   RDW 13.8 02/20/2017 1620   LYMPHSABS 1.9 12/02/2015 0113   MONOABS 0.8 12/02/2015 0113   EOSABS 0.1 12/02/2015 0113   BASOSABS 0.0 12/02/2015 0113    Hgb A1C No results found for: HGBA1C         Assessment & Plan:   Vertigo:  Encouraged her to stay well hydrated RX for Meclizine 25 mg TID prn ER precautions discussed  Return precautions discussed Webb Silversmith, NP

## 2018-06-19 NOTE — Patient Instructions (Signed)
Vertigo    Vertigo means that you feel like you are moving when you are not. Vertigo can also make you feel like things around you are moving when they are not. This feeling can come and go at any time. Vertigo often goes away on its own.  Follow these instructions at home:  · Avoid making fast movements.  · Avoid driving.  · Avoid using heavy machinery.  · Avoid doing any task or activity that might cause danger to you or other people if you would have a vertigo attack while you are doing it.  · Sit down right away if you feel dizzy or have trouble with your balance.  · Take over-the-counter and prescription medicines only as told by your doctor.  · Follow instructions from your doctor about which positions or movements you should avoid.  · Drink enough fluid to keep your pee (urine) clear or pale yellow.  · Keep all follow-up visits as told by your doctor. This is important.  Contact a doctor if:  · Medicine does not help your vertigo.  · You have a fever.  · Your problems get worse or you have new symptoms.  · Your family or friends see changes in your behavior.  · You feel sick to your stomach (nauseous) or you throw up (vomit).  · You have a “pins and needles” feeling or you are numb in part of your body.  Get help right away if:  · You have trouble moving or talking.  · You are always dizzy.  · You pass out (faint).  · You get very bad headaches.  · You feel weak or have trouble using your hands, arms, or legs.  · You have changes in your hearing.  · You have changes in your seeing (vision).  · You get a stiff neck.  · Bright light starts to bother you.  This information is not intended to replace advice given to you by your health care provider. Make sure you discuss any questions you have with your health care provider.  Document Released: 12/28/2007 Document Revised: 08/26/2015 Document Reviewed: 07/13/2014  Elsevier Interactive Patient Education © 2019 [employer]

## 2018-06-19 NOTE — Telephone Encounter (Signed)
Pt. Reports she started feeling dizzy yesterday morning when she woke up. Reports she has had vertigo in the past, "and it would just go away."  Worse when she changes position or changes head position. Has "some headache." No other symptoms. Appointment made with her provider this afternoon.  Reason for Disposition . [1] MODERATE dizziness (e.g., vertigo; feels very unsteady, interferes with normal activities) AND [2] has NOT been evaluated by physician for this  Answer Assessment - Initial Assessment Questions 1. DESCRIPTION: "Describe your dizziness."     Feels like vertigo 2. VERTIGO: "Do you feel like either you or the room is spinning or tilting?"      Yes 3. LIGHTHEADED: "Do you feel lightheaded?" (e.g., somewhat faint, woozy, weak upon standing)      Dizzy 4. SEVERITY: "How bad is it?"  "Can you walk?"   - MILD - Feels unsteady but walking normally.   - MODERATE - Feels very unsteady when walking, but not falling; interferes with normal activities (e.g., school, work) .   - SEVERE - Unable to walk without falling (requires assistance).     Moderate 5. ONSET:  "When did the dizziness begin?"     Yesterday morning 6. AGGRAVATING FACTORS: "Does anything make it worse?" (e.g., standing, change in head position)     Changing positions 7. CAUSE: "What do you think is causing the dizziness?"     Vertigo 8. RECURRENT SYMPTOM: "Have you had dizziness before?" If so, ask: "When was the last time?" "What happened that time?"     Yes 9. OTHER SYMPTOMS: "Do you have any other symptoms?" (e.g., headache, weakness, numbness, vomiting, earache)     Headache x 2 days 10. PREGNANCY: "Is there any chance you are pregnant?" "When was your last menstrual period?"       No  Protocols used: DIZZINESS - VERTIGO-A-AH

## 2018-06-19 NOTE — Telephone Encounter (Signed)
Will see at appt

## 2018-07-17 ENCOUNTER — Encounter: Payer: Self-pay | Admitting: Internal Medicine

## 2018-07-17 ENCOUNTER — Ambulatory Visit (INDEPENDENT_AMBULATORY_CARE_PROVIDER_SITE_OTHER): Payer: Medicare Other | Admitting: Internal Medicine

## 2018-07-17 DIAGNOSIS — I10 Essential (primary) hypertension: Secondary | ICD-10-CM | POA: Diagnosis not present

## 2018-07-17 MED ORDER — AMLODIPINE BESYLATE 10 MG PO TABS: ORAL_TABLET | ORAL | 1 refills | Status: DC

## 2018-07-17 MED ORDER — SPIRONOLACTONE 25 MG PO TABS: ORAL_TABLET | ORAL | 1 refills | Status: DC

## 2018-07-17 MED ORDER — LISINOPRIL 40 MG PO TABS: ORAL_TABLET | ORAL | 1 refills | Status: DC

## 2018-07-17 NOTE — Patient Instructions (Signed)

## 2018-07-17 NOTE — Progress Notes (Signed)
Virtual Visit via Video Note  I connected with Teresa Fry on 07/17/18 at  3:45 PM EDT by a video enabled telemedicine application and verified that I am speaking with the correct person using two identifiers.   I discussed the limitations of evaluation and management by telemedicine and the availability of in person appointments. The patient expressed understanding and agreed to proceed.  Patient Location: Home Provider Location: Office History of Present Illness:  Pt due for follow up of HTN. She checks her blood pressure at home. It usually runs 150/90. She is taking Amlodipine, Lisinopril and Spironolactone as prescribed. She denies adverse side effects. She denies headaches, dizziness, visual changes, chest pain, shortness of breath or swelling in her legs. ECG from 02/2011 reviewed.   Observations/Objective:  Alert and oriented x 3 NAD Behavior, judgement and thought content are normal  Assessment and Plan:  See problem based charting  Follow Up Instructions:    I discussed the assessment and treatment plan with the patient. The patient was provided an opportunity to ask questions and all were answered. The patient agreed with the plan and demonstrated an understanding of the instructions.   The patient was advised to call back or seek an in-person evaluation if the symptoms worsen or if the condition fails to improve as anticipated.   Webb Silversmith, NP

## 2018-07-17 NOTE — Assessment & Plan Note (Signed)
Still elevated per home readings Will have her make lab only appt for CBC and CMET Will have her make nurse visit for BP check Continue Amlodipine, Lisinopril and Spironolactone for now Reinforced DASH diet and exercise for weight lossw Will monitor

## 2018-11-07 ENCOUNTER — Other Ambulatory Visit: Payer: Self-pay

## 2018-11-07 ENCOUNTER — Ambulatory Visit (INDEPENDENT_AMBULATORY_CARE_PROVIDER_SITE_OTHER): Payer: Medicare Other | Admitting: Internal Medicine

## 2018-11-07 ENCOUNTER — Telehealth: Payer: Self-pay

## 2018-11-07 ENCOUNTER — Encounter: Payer: Self-pay | Admitting: Internal Medicine

## 2018-11-07 VITALS — BP 128/86 | HR 76 | Temp 98.4°F | Ht 64.0 in | Wt 198.0 lb

## 2018-11-07 DIAGNOSIS — E7800 Pure hypercholesterolemia, unspecified: Secondary | ICD-10-CM

## 2018-11-07 DIAGNOSIS — E559 Vitamin D deficiency, unspecified: Secondary | ICD-10-CM

## 2018-11-07 DIAGNOSIS — I10 Essential (primary) hypertension: Secondary | ICD-10-CM | POA: Diagnosis not present

## 2018-11-07 DIAGNOSIS — Z0000 Encounter for general adult medical examination without abnormal findings: Secondary | ICD-10-CM

## 2018-11-07 DIAGNOSIS — Z23 Encounter for immunization: Secondary | ICD-10-CM | POA: Diagnosis not present

## 2018-11-07 NOTE — Patient Instructions (Signed)
Health Maintenance After Age 71 After age 71, you are at a higher risk for certain long-term diseases and infections as well as injuries from falls. Falls are a major cause of broken bones and head injuries in people who are older than age 71. Getting regular preventive care can help to keep you healthy and well. Preventive care includes getting regular testing and making lifestyle changes as recommended by your health care provider. Talk with your health care provider about:  Which screenings and tests you should have. A screening is a test that checks for a disease when you have no symptoms.  A diet and exercise plan that is right for you. What should I know about screenings and tests to prevent falls? Screening and testing are the best ways to find a health problem early. Early diagnosis and treatment give you the best chance of managing medical conditions that are common after age 71. Certain conditions and lifestyle choices may make you more likely to have a fall. Your health care provider may recommend:  Regular vision checks. Poor vision and conditions such as cataracts can make you more likely to have a fall. If you wear glasses, make sure to get your prescription updated if your vision changes.  Medicine review. Work with your health care provider to regularly review all of the medicines you are taking, including over-the-counter medicines. Ask your health care provider about any side effects that may make you more likely to have a fall. Tell your health care provider if any medicines that you take make you feel dizzy or sleepy.  Osteoporosis screening. Osteoporosis is a condition that causes the bones to get weaker. This can make the bones weak and cause them to break more easily.  Blood pressure screening. Blood pressure changes and medicines to control blood pressure can make you feel dizzy.  Strength and balance checks. Your health care provider may recommend certain tests to check your  strength and balance while standing, walking, or changing positions.  Foot health exam. Foot pain and numbness, as well as not wearing proper footwear, can make you more likely to have a fall.  Depression screening. You may be more likely to have a fall if you have a fear of falling, feel emotionally low, or feel unable to do activities that you used to do.  Alcohol use screening. Using too much alcohol can affect your balance and may make you more likely to have a fall. What actions can I take to lower my risk of falls? General instructions  Talk with your health care provider about your risks for falling. Tell your health care provider if: ? You fall. Be sure to tell your health care provider about all falls, even ones that seem minor. ? You feel dizzy, sleepy, or off-balance.  Take over-the-counter and prescription medicines only as told by your health care provider. These include any supplements.  Eat a healthy diet and maintain a healthy weight. A healthy diet includes low-fat dairy products, low-fat (lean) meats, and fiber from whole grains, beans, and lots of fruits and vegetables. Home safety  Remove any tripping hazards, such as rugs, cords, and clutter.  Install safety equipment such as grab bars in bathrooms and safety rails on stairs.  Keep rooms and walkways well-lit. Activity   Follow a regular exercise program to stay fit. This will help you maintain your balance. Ask your health care provider what types of exercise are appropriate for you.  If you need a cane or   walker, use it as recommended by your health care provider.  Wear supportive shoes that have nonskid soles. Lifestyle  Do not drink alcohol if your health care provider tells you not to drink.  If you drink alcohol, limit how much you have: ? 0-1 drink a day for women. ? 0-2 drinks a day for men.  Be aware of how much alcohol is in your drink. In the [place] one drink equals one typical bottle of beer (12  oz), one-half glass of wine (5 oz), or one shot of hard liquor (1 oz).  Do not use any products that contain nicotine or tobacco, such as cigarettes and e-cigarettes. If you need help quitting, ask your health care provider. Summary  Having a healthy lifestyle and getting preventive care can help to protect your health and wellness after age 71.  Screening and testing are the best way to find a health problem early and help you avoid having a fall. Early diagnosis and treatment give you the best chance for managing medical conditions that are more common for people who are older than age 71.  Falls are a major cause of broken bones and head injuries in people who are older than age 71. Take precautions to prevent a fall at home.  Work with your health care provider to learn what changes you can make to improve your health and wellness and to prevent falls. This information is not intended to replace advice given to you by your health care provider. Make sure you discuss any questions you have with your health care provider. Document Released: 01/31/2017 Document Revised: 07/11/2018 Document Reviewed: 01/31/2017 Elsevier Patient Education  2020 [employer]  

## 2018-11-07 NOTE — Assessment & Plan Note (Signed)
Continue Amlodipine, Lisinopril and Spironolactone Reinforced DASH diet and exercise for weight loss CMET today

## 2018-11-07 NOTE — Addendum Note (Signed)
Addended by: Lurlean Nanny on: 11/07/2018 03:35 PM   Modules accepted: Orders
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
## 2018-11-07 NOTE — Telephone Encounter (Signed)
Cologuard form has been faxed w/ demographics and insurance card attached

## 2018-11-07 NOTE — Assessment & Plan Note (Signed)
CMET and Lipid profile today Will consider statin if LDL > 130 Continue Niacin for now Reinforced DASH diet

## 2018-11-08 LAB — COMPREHENSIVE METABOLIC PANEL
ALT: 19 U/L (ref 0–35)
AST: 23 U/L (ref 0–37)
Albumin: 4.4 g/dL (ref 3.5–5.2)
Alkaline Phosphatase: 62 U/L (ref 39–117)
BUN: 12 mg/dL (ref 6–23)
CO2: 31 mEq/L (ref 19–32)
Calcium: 10.3 mg/dL (ref 8.4–10.5)
Chloride: 101 mEq/L (ref 96–112)
Creatinine, Ser: 0.91 mg/dL (ref 0.40–1.20)
GFR: 73.61 mL/min (ref >60.00)
Glucose, Bld: 88 mg/dL (ref 70–99)
Potassium: 4.3 mEq/L (ref 3.5–5.1)
Sodium: 140 mEq/L (ref 135–145)
Total Bilirubin: 0.3 mg/dL (ref 0.2–1.2)
Total Protein: 8.4 g/dL — ABNORMAL HIGH (ref 6.0–8.3)

## 2018-11-08 LAB — LIPID PANEL
Cholesterol: 202 mg/dL — ABNORMAL HIGH (ref 0–200)
HDL: 41.9 mg/dL (ref >39.00)
LDL Cholesterol: 129 mg/dL — ABNORMAL HIGH (ref 0–99)
NonHDL: 160.33
Total CHOL/HDL Ratio: 5
Triglycerides: 155 mg/dL — ABNORMAL HIGH (ref 0.0–149.0)
VLDL: 31 mg/dL (ref 0.0–40.0)

## 2018-11-08 LAB — CBC
HCT: 43 % (ref 36.0–46.0)
Hemoglobin: 14 g/dL (ref 12.0–15.0)
MCHC: 32.7 g/dL (ref 30.0–36.0)
MCV: 87.6 fl (ref 78.0–100.0)
Platelets: 288 10*3/uL (ref 150.0–400.0)
RBC: 4.9 Mil/uL (ref 3.87–5.11)
RDW: 13.9 % (ref 11.5–15.5)
WBC: 8.3 10*3/uL (ref 4.0–10.5)

## 2018-11-08 LAB — VITAMIN D 25 HYDROXY (VIT D DEFICIENCY, FRACTURES): VITD: 87.77 ng/mL (ref 30.00–100.00)

## 2018-12-06 ENCOUNTER — Other Ambulatory Visit: Payer: Self-pay

## 2018-12-06 ENCOUNTER — Ambulatory Visit (INDEPENDENT_AMBULATORY_CARE_PROVIDER_SITE_OTHER): Payer: Medicare Other | Admitting: Internal Medicine

## 2018-12-06 ENCOUNTER — Encounter: Payer: Self-pay | Admitting: Internal Medicine

## 2018-12-06 ENCOUNTER — Telehealth: Payer: Self-pay

## 2018-12-06 VITALS — BP 130/82 | HR 84 | Temp 97.9°F | Wt 196.0 lb

## 2018-12-06 DIAGNOSIS — R110 Nausea: Secondary | ICD-10-CM | POA: Diagnosis not present

## 2018-12-06 DIAGNOSIS — R42 Dizziness and giddiness: Secondary | ICD-10-CM | POA: Diagnosis not present

## 2018-12-06 MED ORDER — ONDANSETRON 4 MG PO TBDP: 4.0000 mg | ORAL_TABLET | Freq: Three times a day (TID) | ORAL | 0 refills | Status: DC | PRN

## 2018-12-06 MED ORDER — ONDANSETRON 4 MG PO TBDP
4.0000 mg | ORAL_TABLET | Freq: Once | ORAL | Status: AC
  Administered 2018-12-06: 17:00:00 4 mg via ORAL

## 2018-12-06 NOTE — Addendum Note (Signed)
Addended by: Lurlean Nanny on: 12/06/2018 04:47 PM   Modules accepted: Orders

## 2018-12-06 NOTE — Progress Notes (Signed)
Subjective:    Patient ID: Teresa Fry, female    DOB: 06/19/1945, 73 y.o.   MRN: [id number]  HPI  Pt presents to the clinic today with c/o dizziness and nausea. This started 2 days ago. She describes the dizziness as the room spinning. She reports associated nausea but denies visual changes, vomiting or syncope. She does feel better today than she did a few days ago. She has tried Meclizine in the past but reports it has caused palpitations. She has a history of vertigo.   Review of Systems      Past Medical History:  Diagnosis Date  . Gallstones   . Hypertension     Current Outpatient Medications  Medication Sig Dispense Refill  . amLODipine (NORVASC) 10 MG tablet TAKE 1 TABLET BY MOUTH ONCE DAILY 90 tablet 1  . aspirin 81 MG tablet Take 81 mg by mouth daily.      Marland Kitchen lisinopril (PRINIVIL,ZESTRIL) 40 MG tablet TAKE 1 TABLET BY MOUTH  DAILY 90 tablet 1  . meclizine (ANTIVERT) 25 MG tablet Take 1 tablet (25 mg total) by mouth 3 (three) times daily as needed for dizziness. 30 tablet 0  . Multiple Vitamin (MULTIVITAMIN) capsule Take 1 capsule by mouth daily.      . niacin 500 MG tablet Take 1,000 mg by mouth daily.    Marland Kitchen spironolactone (ALDACTONE) 25 MG tablet TAKE 1 TABLET BY MOUTH DAILY 90 tablet 1   No current facility-administered medications for this visit.     No Known Allergies  Family History  Problem Relation Age of Onset  . Heart disease Father   . Cancer Daughter        breast    Social History   Socioeconomic History  . Marital status: Married    Spouse name: Not on file  . Number of children: Not on file  . Years of education: Not on file  . Highest education level: Not on file  Occupational History  . Not on file  Social Needs  . Financial resource strain: Not on file  . Food insecurity    Worry: Not on file    Inability: Not on file  . Transportation needs    Medical: Not on file    Non-medical: Not on file  Tobacco Use  . Smoking status: Never  Smoker  . Smokeless tobacco: Never Used  Substance and Sexual Activity  . Alcohol use: No    Alcohol/week: 0.0 standard drinks  . Drug use: No  . Sexual activity: Never  Lifestyle  . Physical activity    Days per week: Not on file    Minutes per session: Not on file  . Stress: Not on file  Relationships  . Social Herbalist on phone: Not on file    Gets together: Not on file    Attends religious service: Not on file    Active member of club or organization: Not on file    Attends meetings of clubs or organizations: Not on file    Relationship status: Not on file  . Intimate partner violence    Fear of current or ex partner: Not on file    Emotionally abused: Not on file    Physically abused: Not on file    Forced sexual activity: Not on file  Other Topics Concern  . Not on file  Social History Narrative  . Not on file     Constitutional: Denies fever, malaise, fatigue, headache or abrupt  weight changes.  HEENT: Denies eye pain, eye redness, ear pain, ringing in the ears, wax buildup, runny nose, nasal congestion, bloody nose, or sore throat. Respiratory: Denies difficulty breathing, shortness of breath, cough or sputum production.   Cardiovascular: Denies chest pain, chest tightness, palpitations or swelling in the hands or feet.  Gastrointestinal: Pt reports nausea. Denies abdominal pain, bloating, constipation, diarrhea or blood in the stool.  Musculoskeletal:  Denies decrease in range of motion, difficulty with gait, muscle pain or joint pain and swelling.  Neurological: Pt reports dizziness. Denies difficulty with memory, difficulty with speech or problems with balance and coordination.    No other specific complaints in a complete review of systems (except as listed in HPI above).  Objective:   Physical Exam   There were no vitals taken for this visit. Wt Readings from Last 3 Encounters:  11/07/18 198 lb (89.8 kg)  06/19/18 200 lb (90.7 kg)  04/04/18  202 lb 12 oz (92 kg)    General: Appears her stated age, obese, in NAD. HEENT: Head: normal shape and size; Eyes: sclera white, no icterus, conjunctiva pink, PERRLA and EOMs intact; Ears: Tm's gray and intact, normal light reflex; Neck:  Neck supple, trachea midline. No masses, lumps or thyromegaly present.  Cardiovascular: Normal rate and rhythm. S1,S2 noted.  No murmur, rubs or gallops noted. No JVD or BLE edema.  Pulmonary/Chest: Normal effort and positive vesicular breath sounds. No respiratory distress. No wheezes, rales or ronchi noted.   Musculoskeletal: Normal flexion, extension and rotation of the cervical spine.   Neurological: Alert and oriented.  Coordination normal.   BMET    Component Value Date/Time   NA 140 11/07/2018 1501   K 4.3 11/07/2018 1501   CL 101 11/07/2018 1501   CO2 31 11/07/2018 1501   GLUCOSE 88 11/07/2018 1501   BUN 12 11/07/2018 1501   CREATININE 0.91 11/07/2018 1501   CALCIUM 10.3 11/07/2018 1501   GFRNONAA >60 12/02/2015 0113   GFRAA >60 12/02/2015 0113    Lipid Panel     Component Value Date/Time   CHOL 202 (H) 11/07/2018 1501   TRIG 155.0 (H) 11/07/2018 1501   HDL 41.90 11/07/2018 1501   CHOLHDL 5 11/07/2018 1501   VLDL 31.0 11/07/2018 1501   LDLCALC 129 (H) 11/07/2018 1501    CBC    Component Value Date/Time   WBC 8.3 11/07/2018 1501   RBC 4.90 11/07/2018 1501   HGB 14.0 11/07/2018 1501   HCT 43.0 11/07/2018 1501   PLT 288.0 11/07/2018 1501   MCV 87.6 11/07/2018 1501   MCH 27.6 12/02/2015 0113   MCHC 32.7 11/07/2018 1501   RDW 13.9 11/07/2018 1501   LYMPHSABS 1.9 12/02/2015 0113   MONOABS 0.8 12/02/2015 0113   EOSABS 0.1 12/02/2015 0113   BASOSABS 0.0 12/02/2015 0113    Hgb A1C No results found for: HGBA1C         Assessment & Plan:

## 2018-12-06 NOTE — Telephone Encounter (Signed)
Will discuss at upcoming appt.

## 2018-12-06 NOTE — Telephone Encounter (Signed)
Pt said she has a hx of vertigo; pt has dizziness where room spins and nausea. No CP,H/A or SOB. Pt has no covid symptoms, no travel and no known exposure to + covid. Pt already has appt today at 4:15 with R Baity NP.UC & ED precautions given and pt voiced understanding. Pt will need a note for work.

## 2018-12-06 NOTE — Patient Instructions (Signed)

## 2018-12-06 NOTE — Assessment & Plan Note (Addendum)
Intermittent Encouraged adequate fluid intake Stop Meclizine RX for Zofran 4 mg ODT every 8 hours prn Zofran 4 mg SL x 1 in office Referral for vestibular rehab Work note provided  Return precautions discussed

## 2018-12-12 ENCOUNTER — Telehealth: Payer: Self-pay | Admitting: Internal Medicine

## 2018-12-12 DIAGNOSIS — R42 Dizziness and giddiness: Secondary | ICD-10-CM

## 2018-12-12 NOTE — Telephone Encounter (Signed)
Do you have the order number? When I look it up, the only thing that shows up is the order I placed.

## 2018-12-12 NOTE — Telephone Encounter (Signed)
Please cancel the order you placed and please put in Referral for Vestibular Rehab for dizziness.

## 2018-12-12 NOTE — Telephone Encounter (Signed)
done

## 2018-12-12 NOTE — Addendum Note (Signed)
Addended by: Jearld Fenton on: 12/12/2018 08:24 PM   Modules accepted: Orders

## 2018-12-12 NOTE — Telephone Encounter (Signed)
Place the Referral for Physical Therapy and put in comments for Vestibular Rehab for Dizziness DX

## 2019-03-07 ENCOUNTER — Other Ambulatory Visit: Payer: Self-pay | Admitting: Internal Medicine

## 2019-03-07 DIAGNOSIS — I10 Essential (primary) hypertension: Secondary | ICD-10-CM

## 2019-05-17 ENCOUNTER — Other Ambulatory Visit: Payer: Self-pay | Admitting: Physician Assistant

## 2019-05-17 DIAGNOSIS — U071 COVID-19: Secondary | ICD-10-CM

## 2019-05-17 DIAGNOSIS — I10 Essential (primary) hypertension: Secondary | ICD-10-CM

## 2019-05-17 NOTE — Progress Notes (Signed)
  I connected by phone with Teresa Fry on 05/17/2019 at 9:08 AM to discuss the potential use of an new treatment for mild to moderate COVID-19 viral infection in non-hospitalized patients.  This patient is a 74 y.o. female that meets the FDA criteria for Emergency Use Authorization of bamlanivimab or casirivimab\imdevimab.  Has a (+) direct SARS-CoV-2 viral test result  Has mild or moderate COVID-19   Is ? 72 years of age and weighs ? 40 kg  Is NOT hospitalized due to COVID-19  Is NOT requiring oxygen therapy or requiring an increase in baseline oxygen flow rate due to COVID-19  Is within 10 days of symptom onset  Has at least one of the high risk factor(s) for progression to severe COVID-19 and/or hospitalization as defined in EUA.  Specific high risk criteria : >/= 72 yo & HTN   I have spoken and communicated the following to the patient or parent/caregiver:  1. FDA has authorized the emergency use of bamlanivimab and casirivimab\imdevimab for the treatment of mild to moderate COVID-19 in adults and pediatric patients with positive results of direct SARS-CoV-2 viral testing who are 54 years of age and older weighing at least 40 kg, and who are at high risk for progressing to severe COVID-19 and/or hospitalization.  2. The significant known and potential risks and benefits of bamlanivimab and casirivimab\imdevimab, and the extent to which such potential risks and benefits are unknown.  3. Information on available alternative treatments and the risks and benefits of those alternatives, including clinical trials.  4. Patients treated with bamlanivimab and casirivimab\imdevimab should continue to self-isolate and use infection control measures (e.g., wear mask, isolate, social distance, avoid sharing personal items, clean and disinfect "high touch" surfaces, and frequent handwashing) according to CDC guidelines.   5. The patient or parent/caregiver has the option to accept or refuse  bamlanivimab or casirivimab\imdevimab .  After reviewing this information with the patient, The patient agreed to proceed with receiving the bamlanimivab infusion and will be provided a copy of the Fact sheet prior to receiving the infusion.   Pt set up for 05/18/19 @ 10:30 am with her daughter, [name]. She will bring in a copy of her + test from [facility]. Directions given. Sx onset 2/7.   Angelena Form PA-C 05/17/2019 9:08 AM

## 2019-05-18 ENCOUNTER — Encounter (HOSPITAL_COMMUNITY): Payer: Self-pay

## 2019-05-18 ENCOUNTER — Ambulatory Visit (HOSPITAL_COMMUNITY)
Admission: RE | Admit: 2019-05-18 | Discharge: 2019-05-18 | Disposition: A | Discharge status: Home / Self Care | Payer: Medicare Other | Source: Ambulatory Visit | Attending: Pulmonary Disease | Admitting: Pulmonary Disease

## 2019-05-18 DIAGNOSIS — Z23 Encounter for immunization: Secondary | ICD-10-CM | POA: Insufficient documentation

## 2019-05-18 DIAGNOSIS — I10 Essential (primary) hypertension: Secondary | ICD-10-CM | POA: Insufficient documentation

## 2019-05-18 DIAGNOSIS — U071 COVID-19: Secondary | ICD-10-CM | POA: Insufficient documentation

## 2019-05-18 MED ORDER — METHYLPREDNISOLONE SODIUM SUCC 125 MG IJ SOLR: 125.0000 mg | Freq: Once | INTRAMUSCULAR | Status: DC | PRN

## 2019-05-18 MED ORDER — SODIUM CHLORIDE 0.9 % IV SOLN
INTRAVENOUS | Status: DC | PRN
  Administered 2019-05-18: 11:00:00 250 mL via INTRAVENOUS

## 2019-05-18 MED ORDER — SODIUM CHLORIDE 0.9 % IV SOLN
700.0000 mg | Freq: Once | INTRAVENOUS | Status: AC
  Administered 2019-05-18: 11:00:00 700 mg via INTRAVENOUS
  Filled 2019-05-18: qty 20

## 2019-05-18 MED ORDER — DIPHENHYDRAMINE HCL 50 MG/ML IJ SOLN: 50.0000 mg | Freq: Once | INTRAMUSCULAR | Status: DC | PRN

## 2019-05-18 MED ORDER — FAMOTIDINE IN NACL 20-0.9 MG/50ML-% IV SOLN: 20.0000 mg | Freq: Once | INTRAVENOUS | Status: DC | PRN

## 2019-05-18 MED ORDER — ALBUTEROL SULFATE HFA 108 (90 BASE) MCG/ACT IN AERS: 2.0000 | INHALATION_SPRAY | Freq: Once | RESPIRATORY_TRACT | Status: DC | PRN

## 2019-05-18 MED ORDER — EPINEPHRINE 0.3 MG/0.3ML IJ SOAJ: 0.3000 mg | Freq: Once | INTRAMUSCULAR | Status: DC | PRN

## 2019-05-18 NOTE — Discharge Instructions (Signed)

## 2019-05-18 NOTE — Progress Notes (Signed)
  Diagnosis: COVID-19  Physician: Dr. Joya Gaskins   Procedure: [facility] Med: bamlanivimab infusion - Provided patient with bamlanimivab fact sheet for patients, parents and caregivers prior to infusion.  Complications: No immediate complications noted.  Discharge: Discharged home   Claudia Desanctis 05/18/2019

## 2019-10-09 ENCOUNTER — Other Ambulatory Visit: Payer: Self-pay | Admitting: Internal Medicine

## 2019-10-09 DIAGNOSIS — I10 Essential (primary) hypertension: Secondary | ICD-10-CM

## 2019-10-21 ENCOUNTER — Telehealth: Payer: Self-pay

## 2019-10-21 NOTE — Telephone Encounter (Signed)
Patient contacted the office and states that she needs to be scheduled for her CPE. I did schedule this on 8/31. Patient states she had COVID in February, and she had the infusions, and both of her COVID vaccines, but she states her voice is still very hoarse at times, and that her throat feels raspy. She is wondering if she can be seen in office prior to her CPE to discuss this?

## 2019-10-22 NOTE — Telephone Encounter (Signed)
That is fine for her to schedule an acute appt in office.

## 2019-10-28 NOTE — Telephone Encounter (Signed)
Appt has been scheduled.

## 2019-11-07 ENCOUNTER — Other Ambulatory Visit: Payer: Self-pay

## 2019-11-07 ENCOUNTER — Ambulatory Visit (INDEPENDENT_AMBULATORY_CARE_PROVIDER_SITE_OTHER): Payer: Medicare Other | Admitting: Internal Medicine

## 2019-11-07 ENCOUNTER — Encounter: Payer: Self-pay | Admitting: Internal Medicine

## 2019-11-07 VITALS — BP 130/90 | HR 91 | Temp 97.1°F | Wt 199.0 lb

## 2019-11-07 DIAGNOSIS — M25511 Pain in right shoulder: Secondary | ICD-10-CM

## 2019-11-07 MED ORDER — PREDNISONE 10 MG PO TABS: ORAL_TABLET | ORAL | 0 refills | Status: DC

## 2019-11-07 NOTE — Patient Instructions (Signed)
Shoulder Exercises Ask your health care provider which exercises are safe for you. Do exercises exactly as told by your health care provider and adjust them as directed. It is normal to feel mild stretching, pulling, tightness, or discomfort as you do these exercises. Stop right away if you feel sudden pain or your pain gets worse. Do not begin these exercises until told by your health care provider. Stretching exercises External rotation and abduction This exercise is sometimes called corner stretch. This exercise rotates your arm outward (external rotation) and moves your arm out from your body (abduction). 1. Stand in a doorway with one of your feet slightly in front of the other. This is called a staggered stance. If you cannot reach your forearms to the door frame, stand facing a corner of a room. 2. Choose one of the following positions as told by your health care provider: ? Place your hands and forearms on the door frame above your head. ? Place your hands and forearms on the door frame at the height of your head. ? Place your hands on the door frame at the height of your elbows. 3. Slowly move your weight onto your front foot until you feel a stretch across your chest and in the front of your shoulders. Keep your head and chest upright and keep your abdominal muscles tight. 4. Hold for __________ seconds. 5. To release the stretch, shift your weight to your back foot. Repeat __________ times. Complete this exercise __________ times a day. Extension, standing 1. Stand and hold a broomstick, a cane, or a similar object behind your back. ? Your hands should be a little wider than shoulder width apart. ? Your palms should face away from your back. 2. Keeping your elbows straight and your shoulder muscles relaxed, move the stick away from your body until you feel a stretch in your shoulders (extension). ? Avoid shrugging your shoulders while you move the stick. Keep your shoulder blades tucked  down toward the middle of your back. 3. Hold for __________ seconds. 4. Slowly return to the starting position. Repeat __________ times. Complete this exercise __________ times a day. Range-of-motion exercises Pendulum  1. Stand near a wall or a surface that you can hold onto for balance. 2. Bend at the waist and let your left / right arm hang straight down. Use your other arm to support you. Keep your back straight and do not lock your knees. 3. Relax your left / right arm and shoulder muscles, and move your hips and your trunk so your left / right arm swings freely. Your arm should swing because of the motion of your body, not because you are using your arm or shoulder muscles. 4. Keep moving your hips and trunk so your arm swings in the following directions, as told by your health care provider: ? Side to side. ? Forward and backward. ? In clockwise and counterclockwise circles. 5. Continue each motion for __________ seconds, or for as long as told by your health care provider. 6. Slowly return to the starting position. Repeat __________ times. Complete this exercise __________ times a day. Shoulder flexion, standing  1. Stand and hold a broomstick, a cane, or a similar object. Place your hands a little more than shoulder width apart on the object. Your left / right hand should be palm up, and your other hand should be palm down. 2. Keep your elbow straight and your shoulder muscles relaxed. Push the stick up with your healthy arm to   raise your left / right arm in front of your body, and then over your head until you feel a stretch in your shoulder (flexion). ? Avoid shrugging your shoulder while you raise your arm. Keep your shoulder blade tucked down toward the middle of your back. 3. Hold for __________ seconds. 4. Slowly return to the starting position. Repeat __________ times. Complete this exercise __________ times a day. Shoulder abduction, standing 1. Stand and hold a broomstick,  a cane, or a similar object. Place your hands a little more than shoulder width apart on the object. Your left / right hand should be palm up, and your other hand should be palm down. 2. Keep your elbow straight and your shoulder muscles relaxed. Push the object across your body toward your left / right side. Raise your left / right arm to the side of your body (abduction) until you feel a stretch in your shoulder. ? Do not raise your arm above shoulder height unless your health care provider tells you to do that. ? If directed, raise your arm over your head. ? Avoid shrugging your shoulder while you raise your arm. Keep your shoulder blade tucked down toward the middle of your back. 3. Hold for __________ seconds. 4. Slowly return to the starting position. Repeat __________ times. Complete this exercise __________ times a day. Internal rotation  1. Place your left / right hand behind your back, palm up. 2. Use your other hand to dangle an exercise band, a towel, or a similar object over your shoulder. Grasp the band with your left / right hand so you are holding on to both ends. 3. Gently pull up on the band until you feel a stretch in the front of your left / right shoulder. The movement of your arm toward the center of your body is called internal rotation. ? Avoid shrugging your shoulder while you raise your arm. Keep your shoulder blade tucked down toward the middle of your back. 4. Hold for __________ seconds. 5. Release the stretch by letting go of the band and lowering your hands. Repeat __________ times. Complete this exercise __________ times a day. Strengthening exercises External rotation  1. Sit in a stable chair without armrests. 2. Secure an exercise band to a stable object at elbow height on your left / right side. 3. Place a soft object, such as a folded towel or a small pillow, between your left / right upper arm and your body to move your elbow about 4 inches (10 cm) away  from your side. 4. Hold the end of the exercise band so it is tight and there is no slack. 5. Keeping your elbow pressed against the soft object, slowly move your forearm out, away from your abdomen (external rotation). Keep your body steady so only your forearm moves. 6. Hold for __________ seconds. 7. Slowly return to the starting position. Repeat __________ times. Complete this exercise __________ times a day. Shoulder abduction  1. Sit in a stable chair without armrests, or stand up. 2. Hold a __________ weight in your left / right hand, or hold an exercise band with both hands. 3. Start with your arms straight down and your left / right palm facing in, toward your body. 4. Slowly lift your left / right hand out to your side (abduction). Do not lift your hand above shoulder height unless your health care provider tells you that this is safe. ? Keep your arms straight. ? Avoid shrugging your shoulder while you   do this movement. Keep your shoulder blade tucked down toward the middle of your back. 5. Hold for __________ seconds. 6. Slowly lower your arm, and return to the starting position. Repeat __________ times. Complete this exercise __________ times a day. Shoulder extension 1. Sit in a stable chair without armrests, or stand up. 2. Secure an exercise band to a stable object in front of you so it is at shoulder height. 3. Hold one end of the exercise band in each hand. Your palms should face each other. 4. Straighten your elbows and lift your hands up to shoulder height. 5. Step back, away from the secured end of the exercise band, until the band is tight and there is no slack. 6. Squeeze your shoulder blades together as you pull your hands down to the sides of your thighs (extension). Stop when your hands are straight down by your sides. Do not let your hands go behind your body. 7. Hold for __________ seconds. 8. Slowly return to the starting position. Repeat __________ times.  Complete this exercise __________ times a day. Shoulder row 1. Sit in a stable chair without armrests, or stand up. 2. Secure an exercise band to a stable object in front of you so it is at waist height. 3. Hold one end of the exercise band in each hand. Position your palms so that your thumbs are facing the ceiling (neutral position). 4. Bend each of your elbows to a 90-degree angle (right angle) and keep your upper arms at your sides. 5. Step back until the band is tight and there is no slack. 6. Slowly pull your elbows back behind you. 7. Hold for __________ seconds. 8. Slowly return to the starting position. Repeat __________ times. Complete this exercise __________ times a day. Shoulder press-ups  1. Sit in a stable chair that has armrests. Sit upright, with your feet flat on the floor. 2. Put your hands on the armrests so your elbows are bent and your fingers are pointing forward. Your hands should be about even with the sides of your body. 3. Push down on the armrests and use your arms to lift yourself off the chair. Straighten your elbows and lift yourself up as much as you comfortably can. ? Move your shoulder blades down, and avoid letting your shoulders move up toward your ears. ? Keep your feet on the ground. As you get stronger, your feet should support less of your body weight as you lift yourself up. 4. Hold for __________ seconds. 5. Slowly lower yourself back into the chair. Repeat __________ times. Complete this exercise __________ times a day. Wall push-ups  1. Stand so you are facing a stable wall. Your feet should be about one arm-length away from the wall. 2. Lean forward and place your palms on the wall at shoulder height. 3. Keep your feet flat on the floor as you bend your elbows and lean forward toward the wall. 4. Hold for __________ seconds. 5. Straighten your elbows to push yourself back to the starting position. Repeat __________ times. Complete this exercise  __________ times a day. This information is not intended to replace advice given to you by your health care provider. Make sure you discuss any questions you have with your health care provider. Document Revised: 07/12/2018 Document Reviewed: 04/19/2018 [employer]vier Patient Education  2020 [employer]  

## 2019-11-09 ENCOUNTER — Encounter: Payer: Self-pay | Admitting: Internal Medicine

## 2019-11-09 NOTE — Progress Notes (Signed)
Subjective:    Patient ID: Teresa Fry, female    DOB: 11/15/1945, 74 y.o.   MRN: [id number]  HPI  Pt presents to the clinic today with c/o right shoulder pain. She reports history of a fall 18 months ago, with chronic shoulder pain at that time which resolved after injection by Dr. Lorelei Pont. She reports pain in this same shoulder that started 2-3 weeks ago. She describes the pain as sharp and pulling. The pain does not radiate. She has some tingling in her fingers but denies numbness or weakness. She reports right sided neck pain. She denies dizziness, visual changes. She denies recent injury to the area. She has tried Tylenol OTC with minimal relief of symptoms.   Review of Systems      Past Medical History:  Diagnosis Date  . Gallstones   . Hypertension     Current Outpatient Medications  Medication Sig Dispense Refill  . amLODipine (NORVASC) 10 MG tablet Take 1 tablet by mouth once daily 90 tablet 0  . aspirin 81 MG tablet Take 81 mg by mouth daily.      Marland Kitchen lisinopril (ZESTRIL) 40 MG tablet Take 1 tablet (40 mg total) by mouth daily. MUST SCHEDULE PHYSICAL 90 tablet 0  . Multiple Vitamin (MULTIVITAMIN) capsule Take 1 capsule by mouth daily.      . niacin 500 MG tablet Take 1,000 mg by mouth daily.    . ondansetron (ZOFRAN-ODT) 4 MG disintegrating tablet Take 1 tablet (4 mg total) by mouth every 8 (eight) hours as needed for nausea or vomiting. 20 tablet 0  . spironolactone (ALDACTONE) 25 MG tablet Take 1 tablet (25 mg total) by mouth daily. 90 tablet 0  . predniSONE (DELTASONE) 10 MG tablet Take 3 tabs on days 1-3, take 2 tabs on days 4-6, take 1 tab on days 7-9 18 tablet 0   No current facility-administered medications for this visit.    No Known Allergies  Family History  Problem Relation Age of Onset  . Heart disease Father   . Cancer Daughter        breast    Social History   Socioeconomic History  . Marital status: Married    Spouse name: Not on file  . Number of  children: Not on file  . Years of education: Not on file  . Highest education level: Not on file  Occupational History  . Not on file  Tobacco Use  . Smoking status: Never Smoker  . Smokeless tobacco: Never Used  Substance and Sexual Activity  . Alcohol use: No    Alcohol/week: 0.0 standard drinks  . Drug use: No  . Sexual activity: Never  Other Topics Concern  . Not on file  Social History Narrative  . Not on file   Social Determinants of Health   Financial Resource Strain:   . Difficulty of Paying Living Expenses:   Food Insecurity:   . Worried About Charity fundraiser in the Last Year:   . Arboriculturist in the Last Year:   Transportation Needs:   . Film/video editor (Medical):   Marland Kitchen Lack of Transportation (Non-Medical):   Physical Activity:   . Days of Exercise per Week:   . Minutes of Exercise per Session:   Stress:   . Feeling of Stress :   Social Connections:   . Frequency of Communication with Friends and Family:   . Frequency of Social Gatherings with Friends and Family:   . Attends  Religious Services:   . Active Member of Clubs or Organizations:   . Attends Archivist Meetings:   Marland Kitchen Marital Status:   Intimate Partner Violence:   . Fear of Current or Ex-Partner:   . Emotionally Abused:   Marland Kitchen Physically Abused:   . Sexually Abused:      Constitutional: Denies fever, malaise, fatigue, headache or abrupt weight changes.  Respiratory: Denies difficulty breathing, shortness of breath, cough or sputum production.   Cardiovascular: Denies chest pain, chest tightness, palpitations or swelling in the hands or feet.  Musculoskeletal: Pt reports right side neck pain, right shoulder pain. Denies decrease in range of motion, difficulty with gait, muscle pain or joint swelling.  Skin: Denies redness, rashes, lesions or ulcercations.  Neurological: Pt reports tingling in right hand. Denies dizziness, difficulty with memory, difficulty with speech or problems  with balance and coordination.    No other specific complaints in a complete review of systems (except as listed in HPI above).  Objective:   Physical Exam  BP 130/90   Pulse 91   Temp (!) 97.1 F (36.2 C) (Temporal)   Wt 199 lb (90.3 kg)   SpO2 98%   BMI 34.16 kg/m  Wt Readings from Last 3 Encounters:  11/07/19 199 lb (90.3 kg)  12/06/18 196 lb (88.9 kg)  11/07/18 198 lb (89.8 kg)    General: Appears her stated age, obese, in NAD. Skin: Warm, dry and intact. No rashes noted. Cardiovascular: Normal rate and rhythm. S1,S2 noted.  No murmur, rubs or gallops noted. Radial pulse 2+ on the right.  Pulmonary/Chest: Normal effort and positive vesicular breath sounds. No respiratory distress. No wheezes, rales or ronchi noted.  Musculoskeletal: Decreased external rotation of the right shoulder. Normal internal rotation. Negative drop can test on the right. Pain with palpation over the right AC joint. Strength 5/5 BUE. Hand grips equal. Neurological: Alert and oriented.  Coordination normal.    BMET    Component Value Date/Time   NA 140 11/07/2018 1501   K 4.3 11/07/2018 1501   CL 101 11/07/2018 1501   CO2 31 11/07/2018 1501   GLUCOSE 88 11/07/2018 1501   BUN 12 11/07/2018 1501   CREATININE 0.91 11/07/2018 1501   CALCIUM 10.3 11/07/2018 1501   GFRNONAA >60 12/02/2015 0113   GFRAA >60 12/02/2015 0113    Lipid Panel     Component Value Date/Time   CHOL 202 (H) 11/07/2018 1501   TRIG 155.0 (H) 11/07/2018 1501   HDL 41.90 11/07/2018 1501   CHOLHDL 5 11/07/2018 1501   VLDL 31.0 11/07/2018 1501   LDLCALC 129 (H) 11/07/2018 1501    CBC    Component Value Date/Time   WBC 8.3 11/07/2018 1501   RBC 4.90 11/07/2018 1501   HGB 14.0 11/07/2018 1501   HCT 43.0 11/07/2018 1501   PLT 288.0 11/07/2018 1501   MCV 87.6 11/07/2018 1501   MCH 27.6 12/02/2015 0113   MCHC 32.7 11/07/2018 1501   RDW 13.9 11/07/2018 1501   LYMPHSABS 1.9 12/02/2015 0113   MONOABS 0.8 12/02/2015 0113    EOSABS 0.1 12/02/2015 0113   BASOSABS 0.0 12/02/2015 0113    Hgb A1C No results found for: HGBA1C          Assessment & Plan:   Right Shoulder Pain:  RX for Pred Taper x 9 days If no improvement, make an appt with Dr. Lorelei Pont for further evaluation  Return precautions discussed Webb Silversmith, NP This visit occurred during the SARS-CoV-2  public health emergency.  Safety protocols were in place, including screening questions prior to the visit, additional usage of staff PPE, and extensive cleaning of exam room while observing appropriate contact time as indicated for disinfecting solutions.

## 2019-12-02 ENCOUNTER — Encounter: Payer: Self-pay | Admitting: Internal Medicine

## 2019-12-02 ENCOUNTER — Other Ambulatory Visit: Payer: Self-pay

## 2019-12-02 ENCOUNTER — Ambulatory Visit (INDEPENDENT_AMBULATORY_CARE_PROVIDER_SITE_OTHER): Payer: Medicare Other | Admitting: Internal Medicine

## 2019-12-02 VITALS — BP 132/86 | HR 88 | Temp 97.0°F | Ht 64.25 in | Wt 198.0 lb

## 2019-12-02 DIAGNOSIS — I10 Essential (primary) hypertension: Secondary | ICD-10-CM

## 2019-12-02 DIAGNOSIS — E7800 Pure hypercholesterolemia, unspecified: Secondary | ICD-10-CM

## 2019-12-02 DIAGNOSIS — Z0000 Encounter for general adult medical examination without abnormal findings: Secondary | ICD-10-CM

## 2019-12-02 NOTE — Assessment & Plan Note (Signed)
CMET and lipid profile today Encouraged her to consume a low fat diet Continue Niacin

## 2019-12-02 NOTE — Patient Instructions (Signed)

## 2019-12-02 NOTE — Assessment & Plan Note (Signed)
Controlled on Lisinopril, Amlodipine and Spironolactone CMET today Will monitor

## 2019-12-02 NOTE — Progress Notes (Signed)
HPI:  Patient presents the clinic today for her subsequent annual Medicare wellness exam.  She is also due to follow-up chronic conditions.  HTN: Her BP today is 132/86.  She is taking Amlodipine, Lisinopril and Spironolactone as prescribed.  ECG from 02/2011 reviewed.  HLD: Her last LDL was 129, triglycerides were 155, 11/2018.  She is taking Niacin as prescribed.  She tries to consume a low-fat diet.  Past Medical History:  Diagnosis Date  . Gallstones   . Hypertension     Current Outpatient Medications  Medication Sig Dispense Refill  . amLODipine (NORVASC) 10 MG tablet Take 1 tablet by mouth once daily 90 tablet 0  . aspirin 81 MG tablet Take 81 mg by mouth daily.      Marland Kitchen lisinopril (ZESTRIL) 40 MG tablet Take 1 tablet (40 mg total) by mouth daily. MUST SCHEDULE PHYSICAL 90 tablet 0  . Multiple Vitamin (MULTIVITAMIN) capsule Take 1 capsule by mouth daily.      . niacin 500 MG tablet Take 1,000 mg by mouth daily.    . ondansetron (ZOFRAN-ODT) 4 MG disintegrating tablet Take 1 tablet (4 mg total) by mouth every 8 (eight) hours as needed for nausea or vomiting. 20 tablet 0  . predniSONE (DELTASONE) 10 MG tablet Take 3 tabs on days 1-3, take 2 tabs on days 4-6, take 1 tab on days 7-9 18 tablet 0  . spironolactone (ALDACTONE) 25 MG tablet Take 1 tablet (25 mg total) by mouth daily. 90 tablet 0   No current facility-administered medications for this visit.    No Known Allergies  Family History  Problem Relation Age of Onset  . Heart disease Father   . Cancer Daughter        breast    Social History   Socioeconomic History  . Marital status: Married    Spouse name: Not on file  . Number of children: Not on file  . Years of education: Not on file  . Highest education level: Not on file  Occupational History  . Not on file  Tobacco Use  . Smoking status: Never Smoker  . Smokeless tobacco: Never Used  Substance and Sexual Activity  . Alcohol use: No    Alcohol/week: 0.0  standard drinks  . Drug use: No  . Sexual activity: Never  Other Topics Concern  . Not on file  Social History Narrative  . Not on file   Social Determinants of Health   Financial Resource Strain:   . Difficulty of Paying Living Expenses: Not on file  Food Insecurity:   . Worried About Charity fundraiser in the Last Year: Not on file  . Ran Out of Food in the Last Year: Not on file  Transportation Needs:   . Lack of Transportation (Medical): Not on file  . Lack of Transportation (Non-Medical): Not on file  Physical Activity:   . Days of Exercise per Week: Not on file  . Minutes of Exercise per Session: Not on file  Stress:   . Feeling of Stress : Not on file  Social Connections:   . Frequency of Communication with Friends and Family: Not on file  . Frequency of Social Gatherings with Friends and Family: Not on file  . Attends Religious Services: Not on file  . Active Member of Clubs or Organizations: Not on file  . Attends Archivist Meetings: Not on file  . Marital Status: Not on file  Intimate Partner Violence:   . Fear  of Current or Ex-Partner: Not on file  . Emotionally Abused: Not on file  . Physically Abused: Not on file  . Sexually Abused: Not on file    Hospitiliaztions: None  Health Maintenance:    Flu: 01/2019  Tetanus: > 10 years ago  Pneumovax: 11/2018  Prevnar: 02/2017  Zostavax: Never  Shingrix: Never  Covid: Pfizer  Mammogram: > 5 years ago  Pap Smear: 03/2017  Bone Density: > 5 years ago  Colon Screening: never  Eye Doctor: annually  Dental Exam: annually   Providers:   PCP: Webb Silversmith, NP     I have personally reviewed and have noted:  1. The patient's medical and social history 2. Their use of alcohol, tobacco or illicit drugs 3. Their current medications and supplements 4. The patient's functional ability including ADL's, fall risks, home safety risks and hearing or visual impairment. 5. Diet and physical  activities 6. Evidence for depression or mood disorder  Subjective:   Review of Systems:   Constitutional: Denies fever, malaise, fatigue, headache or abrupt weight changes.  HEENT: Denies eye pain, eye redness, ear pain, ringing in the ears, wax buildup, runny nose, nasal congestion, bloody nose, or sore throat. Respiratory: Denies difficulty breathing, shortness of breath, cough or sputum production.   Cardiovascular: Denies chest pain, chest tightness, palpitations or swelling in the hands or feet.  Gastrointestinal: Denies abdominal pain, bloating, constipation, diarrhea or blood in the stool.  GU: Denies urgency, frequency, pain with urination, burning sensation, blood in urine, odor or discharge. Musculoskeletal: Denies decrease in range of motion, difficulty with gait, muscle pain or joint pain and swelling.  Skin: Denies redness, rashes, lesions or ulcercations.  Neurological: Denies dizziness, difficulty with memory, difficulty with speech or problems with balance and coordination.  Psych: Denies anxiety, depression, SI/HI.  No other specific complaints in a complete review of systems (except as listed in HPI above).  Objective:  PE:  BP 132/86   Pulse 88   Temp (!) 97 F (36.1 C) (Temporal)   Ht 5' 4.25" (1.632 m)   Wt 198 lb (89.8 kg)   SpO2 98%   BMI 33.72 kg/m   Wt Readings from Last 3 Encounters:  11/07/19 199 lb (90.3 kg)  12/06/18 196 lb (88.9 kg)  11/07/18 198 lb (89.8 kg)    General: Appears her stated age, obese, in NAD. Skin: Warm, dry and intact. No rashes noted. Skin tags noted on neck. HEENT: Head: normal shape and size; Eyes: sclera white, no icterus, conjunctiva pink, PERRLA and EOMs intact;  Neck: Neck supple, trachea midline. No masses, lumps or thyromegaly present.  Cardiovascular: Normal rate and rhythm. S1,S2 noted.  No murmur, rubs or gallops noted. No JVD or BLE edema. Pulmonary/Chest: Normal effort and positive vesicular breath sounds. No  respiratory distress. No wheezes, rales or ronchi noted.  Abdomen: Soft and nontender. Normal bowel sounds. No distention or masses noted. Liver, spleen and kidneys non palpable. Musculoskeletal: Strength 5/5 BUE/BLE. No difficulty with gait. Neurological: Alert and oriented. Cranial nerves II-XII grossly intact. Coordination normal.  Psychiatric: Mood and affect normal. Behavior is normal. Judgment and thought content normal.    BMET    Component Value Date/Time   NA 140 11/07/2018 1501   K 4.3 11/07/2018 1501   CL 101 11/07/2018 1501   CO2 31 11/07/2018 1501   GLUCOSE 88 11/07/2018 1501   BUN 12 11/07/2018 1501   CREATININE 0.91 11/07/2018 1501   CALCIUM 10.3 11/07/2018 1501   GFRNONAA >  60 12/02/2015 0113   GFRAA >60 12/02/2015 0113    Lipid Panel     Component Value Date/Time   CHOL 202 (H) 11/07/2018 1501   TRIG 155.0 (H) 11/07/2018 1501   HDL 41.90 11/07/2018 1501   CHOLHDL 5 11/07/2018 1501   VLDL 31.0 11/07/2018 1501   LDLCALC 129 (H) 11/07/2018 1501    CBC    Component Value Date/Time   WBC 8.3 11/07/2018 1501   RBC 4.90 11/07/2018 1501   HGB 14.0 11/07/2018 1501   HCT 43.0 11/07/2018 1501   PLT 288.0 11/07/2018 1501   MCV 87.6 11/07/2018 1501   MCH 27.6 12/02/2015 0113   MCHC 32.7 11/07/2018 1501   RDW 13.9 11/07/2018 1501   LYMPHSABS 1.9 12/02/2015 0113   MONOABS 0.8 12/02/2015 0113   EOSABS 0.1 12/02/2015 0113   BASOSABS 0.0 12/02/2015 0113    Hgb A1C No results found for: HGBA1C    Assessment and Plan:   Medicare Annual Wellness Visit:  Diet: She does not eat a lot of meat. She consumes fruits and veggies daily. She tries to avoid fried foods. She drinks mostly cranberry juice, soda, and water. Physical activity: Body weight exercise Depression/mood screen: Negative, PHQ 9 score of 0 Hearing: Intact to whispered voice Visual acuity: Grossly normal, performs annual eye exam  ADLs: Capable Fall risk: None Home safety: Good Cognitive  evaluation: Intact to orientation, naming, recall and repetition EOL planning: No adv directives, full code/ I agree  Preventative Medicine: Encouraged her to get a flu shot in the fall.  She declines tetanus for financial reasons.  Advised her if she gets bitten by an animal or cut with metal she will need to go get a tetanus vaccine.  Pneumovax, Prevnar and Covid UTD.  Encouraged her to get a Shingrix vaccine.  Pap smear UTD.  She declines mammogram and bone density.  Cologuard ordered. Encouraged her to consume a balanced diet and exercise regimen.  Advised her to see an eye doctor and dentist annually.  Will check CBC, C met, lipid, and vitamin D today. Due dates for screening exam given to patient as part of her AVS.   Next appointment: 1 year Medicare Wellness Exam   Webb Silversmith, NP This visit occurred during the SARS-CoV-2 public health emergency.  Safety protocols were in place, including screening questions prior to the visit, additional usage of staff PPE, and extensive cleaning of exam room while observing appropriate contact time as indicated for disinfecting solutions.

## 2019-12-03 LAB — COMPREHENSIVE METABOLIC PANEL
ALT: 17 U/L (ref 0–35)
AST: 22 U/L (ref 0–37)
Albumin: 4.4 g/dL (ref 3.5–5.2)
Alkaline Phosphatase: 53 U/L (ref 39–117)
BUN: 9 mg/dL (ref 6–23)
CO2: 32 mEq/L (ref 19–32)
Calcium: 10.2 mg/dL (ref 8.4–10.5)
Chloride: 100 mEq/L (ref 96–112)
Creatinine, Ser: 1 mg/dL (ref 0.40–1.20)
GFR: 65.82 mL/min (ref >60.00)
Glucose, Bld: 121 mg/dL — ABNORMAL HIGH (ref 70–99)
Potassium: 3.9 mEq/L (ref 3.5–5.1)
Sodium: 140 mEq/L (ref 135–145)
Total Bilirubin: 0.4 mg/dL (ref 0.2–1.2)
Total Protein: 8.1 g/dL (ref 6.0–8.3)

## 2019-12-03 LAB — CBC
HCT: 41.2 % (ref 36.0–46.0)
Hemoglobin: 13.6 g/dL (ref 12.0–15.0)
MCHC: 33.1 g/dL (ref 30.0–36.0)
MCV: 88.2 fl (ref 78.0–100.0)
Platelets: 283 10*3/uL (ref 150.0–400.0)
RBC: 4.67 Mil/uL (ref 3.87–5.11)
RDW: 13.9 % (ref 11.5–15.5)
WBC: 7.6 10*3/uL (ref 4.0–10.5)

## 2019-12-03 LAB — LIPID PANEL
Cholesterol: 223 mg/dL — ABNORMAL HIGH (ref 0–200)
HDL: 44.8 mg/dL (ref >39.00)
LDL Cholesterol: 146 mg/dL — ABNORMAL HIGH (ref 0–99)
NonHDL: 178.17
Total CHOL/HDL Ratio: 5
Triglycerides: 159 mg/dL — ABNORMAL HIGH (ref 0.0–149.0)
VLDL: 31.8 mg/dL (ref 0.0–40.0)

## 2019-12-03 LAB — VITAMIN D 25 HYDROXY (VIT D DEFICIENCY, FRACTURES): VITD: 88.25 ng/mL (ref 30.00–100.00)

## 2019-12-05 ENCOUNTER — Other Ambulatory Visit (INDEPENDENT_AMBULATORY_CARE_PROVIDER_SITE_OTHER): Payer: Medicare Other

## 2019-12-05 DIAGNOSIS — R7309 Other abnormal glucose: Secondary | ICD-10-CM | POA: Diagnosis not present

## 2019-12-05 LAB — HEMOGLOBIN A1C: Hgb A1c MFr Bld: 5.6 % (ref 4.6–6.5)

## 2019-12-23 ENCOUNTER — Telehealth: Payer: Self-pay | Admitting: *Deleted

## 2019-12-23 DIAGNOSIS — Z1211 Encounter for screening for malignant neoplasm of colon: Secondary | ICD-10-CM

## 2019-12-23 NOTE — Telephone Encounter (Signed)
Spoke with Chief Strategy Officer Rep [employer] and he advise me that they received a Cologuard form with a diagnosis code of I10 which is HTN and not screening for colon cancer. [employer] said they will process this if that's the diagnosis code PCP wants to use but they don't think it will be covered by insurance without some type of "colon cancer screening" diagnosis. [employer] said if you still want him to process this or if you have any questions to call back at [phone]. If you want to change the diagnosis then [employer] said you all can just refax a new order and they will cancel this order and then no call back will be necessary

## 2019-12-23 NOTE — Addendum Note (Signed)
Addended by: Jearld Fenton on: 12/23/2019 08:17 PM   Modules accepted: Orders

## 2019-12-23 NOTE — Telephone Encounter (Signed)
Reordered, please place form( should be on printer on in box above printer) on my desk for signature, and then we will refax with correct diagnosis.

## 2019-12-25 NOTE — Telephone Encounter (Addendum)
[employer] called checking the status of this, they have not shipped it but are going to soon and still don't think it will be covered because they have not heard from our office regarding the HTN diagnosis on form  [employer] said if we can call them the order # is [id number] or if we are going to send in a new order they are requesting [place] to do it soon before they ship order

## 2019-12-25 NOTE — Telephone Encounter (Signed)
Order has been refaxed with change in Dx code

## 2020-01-15 ENCOUNTER — Other Ambulatory Visit: Payer: Self-pay | Admitting: Internal Medicine

## 2020-01-15 DIAGNOSIS — I10 Essential (primary) hypertension: Secondary | ICD-10-CM

## 2020-01-20 NOTE — Telephone Encounter (Signed)
Pt called checking on rx Pt has 2 more pills left   She also needs refill on lisinopril   [facility] [place]

## 2020-01-21 ENCOUNTER — Encounter: Payer: Self-pay | Admitting: Internal Medicine

## 2020-01-22 MED ORDER — LISINOPRIL 40 MG PO TABS: 40.0000 mg | ORAL_TABLET | Freq: Every day | ORAL | 2 refills | Status: DC

## 2020-01-22 NOTE — Addendum Note (Signed)
Addended by: Lurlean Nanny on: 01/22/2020 04:59 PM   Modules accepted: Orders

## 2020-01-26 MED ORDER — LOVASTATIN 10 MG PO TABS: 10.0000 mg | ORAL_TABLET | Freq: Every day | ORAL | 2 refills | Status: DC

## 2020-01-26 NOTE — Addendum Note (Signed)
Addended by: Lurlean Nanny on: 01/26/2020 10:14 AM   Modules accepted: Orders

## 2020-02-03 LAB — COLOGUARD
COLOGUARD: POSITIVE — AB
Cologuard: POSITIVE — AB

## 2020-02-05 ENCOUNTER — Telehealth: Payer: Self-pay | Admitting: Internal Medicine

## 2020-02-05 DIAGNOSIS — R195 Other fecal abnormalities: Secondary | ICD-10-CM

## 2020-02-05 NOTE — Telephone Encounter (Signed)
Anderson Malta called to make sure you receive abnormal colorguard results  This was 11/2  Ask for provider support

## 2020-02-07 NOTE — Telephone Encounter (Signed)
Yes, notify pt of positive result. I recommend referral to GI for colonoscopy. If she is agreeable, let me know if she prefers [place] or [place].

## 2020-02-13 NOTE — Telephone Encounter (Signed)
Pt is aware as instructed and expressed understanding, she prefers GI in [place] for referral

## 2020-02-15 NOTE — Telephone Encounter (Signed)
Referral placed.

## 2020-02-15 NOTE — Addendum Note (Signed)
Addended by: Jearld Fenton on: 02/15/2020 08:26 PM   Modules accepted: Orders

## 2020-04-27 ENCOUNTER — Other Ambulatory Visit: Payer: Self-pay

## 2020-04-27 ENCOUNTER — Other Ambulatory Visit (INDEPENDENT_AMBULATORY_CARE_PROVIDER_SITE_OTHER): Payer: Medicare Other

## 2020-04-27 DIAGNOSIS — E7800 Pure hypercholesterolemia, unspecified: Secondary | ICD-10-CM | POA: Diagnosis not present

## 2020-04-27 LAB — COMPREHENSIVE METABOLIC PANEL
ALT: 14 U/L (ref 0–35)
AST: 21 U/L (ref 0–37)
Albumin: 4.6 g/dL (ref 3.5–5.2)
Alkaline Phosphatase: 56 U/L (ref 39–117)
BUN: 13 mg/dL (ref 6–23)
CO2: 32 mEq/L (ref 19–32)
Calcium: 10.5 mg/dL (ref 8.4–10.5)
Chloride: 101 mEq/L (ref 96–112)
Creatinine, Ser: 1.07 mg/dL (ref 0.40–1.20)
GFR: 51.69 mL/min — ABNORMAL LOW (ref >60.00)
Glucose, Bld: 141 mg/dL — ABNORMAL HIGH (ref 70–99)
Potassium: 3.7 mEq/L (ref 3.5–5.1)
Sodium: 140 mEq/L (ref 135–145)
Total Bilirubin: 0.5 mg/dL (ref 0.2–1.2)
Total Protein: 8.3 g/dL (ref 6.0–8.3)

## 2020-04-27 LAB — LIPID PANEL
Cholesterol: 171 mg/dL (ref 0–200)
HDL: 44.1 mg/dL (ref >39.00)
LDL Cholesterol: 106 mg/dL — ABNORMAL HIGH (ref 0–99)
NonHDL: 126.63
Total CHOL/HDL Ratio: 4
Triglycerides: 103 mg/dL (ref 0.0–149.0)
VLDL: 20.6 mg/dL (ref 0.0–40.0)

## 2020-04-28 ENCOUNTER — Other Ambulatory Visit: Payer: Self-pay | Admitting: Internal Medicine

## 2020-08-16 ENCOUNTER — Ambulatory Visit (INDEPENDENT_AMBULATORY_CARE_PROVIDER_SITE_OTHER): Payer: Medicare (Managed Care) | Admitting: Family Medicine

## 2020-08-16 ENCOUNTER — Other Ambulatory Visit: Payer: Self-pay

## 2020-08-16 ENCOUNTER — Encounter: Payer: Self-pay | Admitting: Family Medicine

## 2020-08-16 ENCOUNTER — Ambulatory Visit (INDEPENDENT_AMBULATORY_CARE_PROVIDER_SITE_OTHER)
Admission: RE | Admit: 2020-08-16 | Discharge: 2020-08-16 | Disposition: A | Discharge status: Home / Self Care | Payer: Medicare (Managed Care) | Source: Ambulatory Visit | Attending: Family Medicine | Admitting: Family Medicine

## 2020-08-16 VITALS — BP 118/70 | HR 80 | Temp 98.0°F | Ht 64.5 in | Wt 191.0 lb

## 2020-08-16 DIAGNOSIS — M545 Low back pain, unspecified: Secondary | ICD-10-CM

## 2020-08-16 MED ORDER — MELOXICAM 15 MG PO TABS: 15.0000 mg | ORAL_TABLET | Freq: Every day | ORAL | 2 refills | Status: DC

## 2020-08-16 NOTE — Progress Notes (Signed)
Teresa Fry T. Teresa Kloepfer, MD, [place] at [facility] [place], [address]  Phone: [phone]  FAX: [phone]  Teresa Fry - 75 y.o. female  MRN [id number]  Date of Birth: February 15, 1946  Date: 08/16/2020  PCP: Jearld Fenton, NP  Referral: Jearld Fenton, NP  Chief Complaint  Patient presents with  . Back Pain    Lower back, states that after she does a little bit of walking her whole lower back starts hurting.     This visit occurred during the SARS-CoV-2 public health emergency.  Safety protocols were in place, including screening questions prior to the visit, additional usage of staff PPE, and extensive cleaning of exam room while observing appropriate contact time as indicated for disinfecting solutions.   Subjective:   Teresa Fry is a 75 y.o. very pleasant female patient who presents with the following: Back Pain  ongoing for approximately: Multiple months, but it is worse now The patient has had back pain before. The back pain is localized into the lumbar spine area. They also describe no radiculopathy.  Multiple months.  No injury or anything. No injuries.  Has a stationary bike, but she has not done this recently  No ice or heat.  No meds  No numbness or tingling. No bowel or bladder incontinence. No focal weakness. Prior interventions: None Physical therapy: No Chiropractic manipulations: No Acupuncture: No Osteopathic manipulation: No Heat or cold: Not tried yet   Review of Systems is noted in the HPI, as appropriate  Objective:   Blood pressure 118/70, pulse 80, temperature 98 F (36.7 C), temperature source Temporal, height 5' 4.5" (1.638 m), weight 191 lb (86.6 kg), SpO2 97 %.  GEN: No acute distress; alert,appropriate. PULM: Breathing comfortably in no respiratory distress PSYCH: Normally interactive.   Range of motion at  the waist: Flexion, rotation  and lateral bending: Full  No echymosis or edema Rises to examination table with no difficulty Gait: minimally antalgic  Inspection/Deformity: No abnormality Paraspinus T: She does have some tenderness from approximately L4-S1 bilaterally  B Ankle Dorsiflexion (L5,4): 5/5 B Great Toe Dorsiflexion (L5,4): 5/5 Heel Walk (L5): WNL Toe Walk (S1): WNL Rise/Squat (L4): WNL, mild pain  SENSORY B Medial Foot (L4): WNL B Dorsum (L5): WNL B Lateral (S1): WNL Light Touch: WNL Pinprick: WNL  REFLEXES Knee (L4): 2+ Ankle (S1): 2+  B SLR, seated: neg B SLR, supine: neg B FABER: Back pain B Reverse FABER: neg B Greater Troch: NT B Log Roll: neg B Stork: NT B Sciatic Notch: NT  Radiology: No results found.  Assessment and Plan:     ICD-10-CM   1. Low back pain of over 3 months duration  M54.50 DG Lumbar Spine Complete   Acute on chronic back pain with exacerbation.  Anatomy reviewed. Conservative algorithms for acute back pain generally begin with the following: In this case, I am going to have the patient start some oral meloxicam  Start with medications, core rehab, and progress from there following low back pain algorithm. No red flags are present.  Patient Instructions  Start riding your back again  Use a heating if feeling stiff  Call if not improving 3-4 weeks, and then physical therapy can often help.    Meds ordered this encounter  Medications  . meloxicam (MOBIC) 15 MG tablet    Sig: Take 1 tablet (15 mg total) by mouth daily.  Dispense:  30 tablet    Refill:  2   Medications Discontinued During This Encounter  Medication Reason  . niacin 500 MG tablet No longer needed (for PRN medications)   Orders Placed This Encounter  Procedures  . DG Lumbar Spine Complete    Signed,  Earon Rivest T. Nijah Orlich, MD   Outpatient Encounter Medications as of 08/16/2020  Medication Sig  . meloxicam (MOBIC) 15 MG tablet Take 1 tablet (15 mg total) by mouth daily.  Marland Kitchen  amLODipine (NORVASC) 10 MG tablet Take 1 tablet by mouth once daily  . aspirin 81 MG tablet Take 81 mg by mouth daily.    Marland Kitchen lisinopril (ZESTRIL) 40 MG tablet Take 1 tablet (40 mg total) by mouth daily.  Marland Kitchen lovastatin (MEVACOR) 10 MG tablet TAKE 1 TABLET BY MOUTH AT BEDTIME  . Misc Natural Products (NEURIVA) CAPS Take by mouth.  . Multiple Vitamin (MULTIVITAMIN) capsule Take 1 capsule by mouth daily.    Marland Kitchen spironolactone (ALDACTONE) 25 MG tablet Take 1 tablet by mouth once daily  . [DISCONTINUED] niacin 500 MG tablet Take 1,000 mg by mouth daily.   No facility-administered encounter medications on file as of 08/16/2020.

## 2020-08-16 NOTE — Patient Instructions (Signed)
Start riding your back again  Use a heating if feeling stiff  Call if not improving 3-4 weeks, and then physical therapy can often help.

## 2020-09-05 ENCOUNTER — Encounter (HOSPITAL_COMMUNITY): Payer: Self-pay | Admitting: Emergency Medicine

## 2020-09-05 ENCOUNTER — Emergency Department (HOSPITAL_COMMUNITY)
Admission: EM | Admit: 2020-09-05 | Discharge: 2020-09-05 | Disposition: A | Discharge status: Home / Self Care | Payer: Medicare (Managed Care) | Attending: Emergency Medicine | Admitting: Emergency Medicine

## 2020-09-05 ENCOUNTER — Other Ambulatory Visit: Payer: Self-pay

## 2020-09-05 ENCOUNTER — Emergency Department (HOSPITAL_COMMUNITY): Payer: Medicare (Managed Care)

## 2020-09-05 DIAGNOSIS — R519 Headache, unspecified: Secondary | ICD-10-CM | POA: Diagnosis not present

## 2020-09-05 DIAGNOSIS — Z7982 Long term (current) use of aspirin: Secondary | ICD-10-CM | POA: Insufficient documentation

## 2020-09-05 DIAGNOSIS — M542 Cervicalgia: Secondary | ICD-10-CM | POA: Insufficient documentation

## 2020-09-05 DIAGNOSIS — I10 Essential (primary) hypertension: Secondary | ICD-10-CM | POA: Insufficient documentation

## 2020-09-05 DIAGNOSIS — Z79899 Other long term (current) drug therapy: Secondary | ICD-10-CM | POA: Insufficient documentation

## 2020-09-05 LAB — CBC
HCT: 43 % (ref 36.0–46.0)
Hemoglobin: 13.8 g/dL (ref 12.0–15.0)
MCH: 28.5 pg (ref 26.0–34.0)
MCHC: 32.1 g/dL (ref 30.0–36.0)
MCV: 88.7 fL (ref 80.0–100.0)
Platelets: 293 10*3/uL (ref 150–400)
RBC: 4.85 MIL/uL (ref 3.87–5.11)
RDW: 14.1 % (ref 11.5–15.5)
WBC: 11 10*3/uL — ABNORMAL HIGH (ref 4.0–10.5)
nRBC: 0 % (ref 0.0–0.2)

## 2020-09-05 LAB — DIFFERENTIAL
Abs Immature Granulocytes: 0.03 10*3/uL (ref 0.00–0.07)
Basophils Absolute: 0 10*3/uL (ref 0.0–0.1)
Basophils Relative: 0 %
Eosinophils Absolute: 0.1 10*3/uL (ref 0.0–0.5)
Eosinophils Relative: 1 %
Immature Granulocytes: 0 %
Lymphocytes Relative: 10 %
Lymphs Abs: 1.1 10*3/uL (ref 0.7–4.0)
Monocytes Absolute: 0.8 10*3/uL (ref 0.1–1.0)
Monocytes Relative: 7 %
Neutro Abs: 8.9 10*3/uL — ABNORMAL HIGH (ref 1.7–7.7)
Neutrophils Relative %: 82 %

## 2020-09-05 LAB — COMPREHENSIVE METABOLIC PANEL
ALT: 16 U/L (ref 0–44)
AST: 23 U/L (ref 15–41)
Albumin: 4 g/dL (ref 3.5–5.0)
Alkaline Phosphatase: 63 U/L (ref 38–126)
Anion gap: 10 (ref 5–15)
BUN: 7 mg/dL — ABNORMAL LOW (ref 8–23)
CO2: 29 mmol/L (ref 22–32)
Calcium: 9.7 mg/dL (ref 8.9–10.3)
Chloride: 103 mmol/L (ref 98–111)
Creatinine, Ser: 1.02 mg/dL — ABNORMAL HIGH (ref 0.44–1.00)
GFR, Estimated: 58 mL/min — ABNORMAL LOW (ref >60)
Glucose, Bld: 135 mg/dL — ABNORMAL HIGH (ref 70–99)
Potassium: 3.3 mmol/L — ABNORMAL LOW (ref 3.5–5.1)
Sodium: 142 mmol/L (ref 135–145)
Total Bilirubin: 0.5 mg/dL (ref 0.3–1.2)
Total Protein: 7.9 g/dL (ref 6.5–8.1)

## 2020-09-05 LAB — PROTIME-INR
INR: 1 (ref 0.8–1.2)
Prothrombin Time: 13.5 seconds (ref 11.4–15.2)

## 2020-09-05 LAB — APTT: aPTT: 28 seconds (ref 24–36)

## 2020-09-05 MED ORDER — HALOPERIDOL LACTATE 5 MG/ML IJ SOLN: 2.0000 mg | Freq: Once | INTRAMUSCULAR | Status: DC

## 2020-09-05 MED ORDER — KETOROLAC TROMETHAMINE 30 MG/ML IJ SOLN
15.0000 mg | Freq: Once | INTRAMUSCULAR | Status: AC
  Administered 2020-09-05: 15 mg via INTRAVENOUS
  Filled 2020-09-05: qty 1

## 2020-09-05 MED ORDER — METOCLOPRAMIDE HCL 5 MG/ML IJ SOLN
10.0000 mg | Freq: Once | INTRAMUSCULAR | Status: AC
  Administered 2020-09-05: 10 mg via INTRAVENOUS
  Filled 2020-09-05: qty 2

## 2020-09-05 MED ORDER — SODIUM CHLORIDE 0.9% FLUSH: 3.0000 mL | Freq: Once | INTRAVENOUS | Status: DC

## 2020-09-05 NOTE — ED Notes (Addendum)
Resting without complaints.

## 2020-09-05 NOTE — ED Notes (Signed)
Pt denies any numbness or tingling. Denies any neurological changes. PEARL at 82mm.  States has had a HA since Thursday that comes and goes.  States she has a pulling sensation in the back of her neck and head. NIHSS of zero.

## 2020-09-05 NOTE — ED Triage Notes (Signed)
Pt reports headache, neck pain, and "choking sensation" x 3 days.  No arm or leg weakness/drift.

## 2020-09-05 NOTE — Discharge Instructions (Addendum)
We saw you in the ER for headaches. All the labs and imaging are normal. We are not sure what is causing your headaches, however, there appears to be no evidence of infection, bleeds or tumors based on our exam and results.  Please take tylenol round the clock for the next 48 hours, and take other meds prescribed only for break through pain.   See the neurologist as requested for new onset headaches.  Please return to the ER if the headache gets severe and in not improving, you have associated new one sided numbness, tingling, weakness or confusion, seizures, poor balance or poor vision.

## 2020-09-05 NOTE — ED Provider Notes (Signed)
Patient care assumed at 1500. Patient here for evaluation of occipital headache over the last few days. CTA is pending.  Patient declines CTA on reassessment. This would require a larger IV and she declines further IV sticks. Her headache has resolved after headache cocktail. Discussed with patient possible misdiagnosis of CTA was not obtained and potential consequences due to this. She acknowledges these risks and continues to not desire further workup in the department at this time. Feel that patient is stable for discharge home with outpatient neurology follow-up and return precautions.   Quintella Reichert, MD 09/05/20 [phone]

## 2020-09-05 NOTE — ED Provider Notes (Signed)
[facility] Provider Note   CSN: [id number] Arrival date & time: 09/05/20  1248     History Chief Complaint  Patient presents with  . Headache    Teresa Fry is a 75 y.o. female.  HPI     75 year old female comes in w/ chief complaint of headache.  Patient has history of hypertension.  She reports that over the last 3 days she has been having headaches.  The headaches are over the occiput, and there is some pulling sensation from her neck.  There is no preceding trauma.  Patient denies any numbness, tingling, vision change, dizziness.  No history of similar headaches in the past.  Patient has been taking over-the-counter medications without significant relief.   Past Medical History:  Diagnosis Date  . Gallstones   . Hypertension     Patient Active Problem List   Diagnosis Date Noted  . Hyperlipidemia 12/17/2017  . Essential hypertension 03/10/2015    Past Surgical History:  Procedure Laterality Date  . CHOLECYSTECTOMY  02/03/11     OB History   No obstetric history on file.     Family History  Problem Relation Age of Onset  . Heart disease Father   . Cancer Daughter        breast    Social History   Tobacco Use  . Smoking status: Never Smoker  . Smokeless tobacco: Never Used  Substance Use Topics  . Alcohol use: No    Alcohol/week: 0.0 standard drinks  . Drug use: No    Home Medications Prior to Admission medications   Medication Sig Start Date End Date Taking? Authorizing Provider  amLODipine (NORVASC) 10 MG tablet Take 1 tablet by mouth once daily 01/20/20  Yes Baity, Coralie Keens, NP  aspirin 81 MG tablet Take 81 mg by mouth daily.   Yes [provider]  lisinopril (ZESTRIL) 40 MG tablet Take 1 tablet (40 mg total) by mouth daily. 01/22/20  Yes Baity, Coralie Keens, NP  lovastatin (MEVACOR) 10 MG tablet TAKE 1 TABLET BY MOUTH AT BEDTIME Patient taking differently: Take 10 mg by mouth at bedtime. 05/03/20   Yes Jearld Fenton, NP  meloxicam (MOBIC) 15 MG tablet Take 1 tablet (15 mg total) by mouth daily. 08/16/20  Yes Copland, Frederico Hamman, MD  MISC NATURAL PRODUCTS PO Take 1 tablet by mouth daily. Neuriva   Yes [provider]  Multiple Vitamin (MULTIVITAMIN) capsule Take 1 capsule by mouth daily.   Yes [provider]  spironolactone (ALDACTONE) 25 MG tablet Take 1 tablet by mouth once daily 01/20/20  Yes Baity, Coralie Keens, NP    Allergies    Patient has no known allergies.  Review of Systems   Review of Systems  Constitutional: Positive for activity change.  Eyes: Negative for visual disturbance.  Respiratory: Negative for shortness of breath.   Cardiovascular: Negative for chest pain.  Musculoskeletal: Positive for neck pain.  Neurological: Positive for headaches. Negative for dizziness, syncope, speech difficulty, weakness, light-headedness and numbness.  All other systems reviewed and are negative.   Physical Exam Updated Vital Signs BP 128/88   Pulse 88   Temp 98.5 F (36.9 C) (Oral)   Resp (!) 25   SpO2 96%   Physical Exam Vitals and nursing note reviewed.  Constitutional:      Appearance: She is well-developed.  HENT:     Head: Normocephalic and atraumatic.  Eyes:     General: No visual field deficit. Neck:  Comments: No midline C-spine tenderness Cardiovascular:     Rate and Rhythm: Normal rate.  Pulmonary:     Effort: Pulmonary effort is normal.  Abdominal:     General: Bowel sounds are normal.  Musculoskeletal:     Cervical back: Normal range of motion and neck supple.  Skin:    General: Skin is warm and dry.  Neurological:     Mental Status: She is alert and oriented to person, place, and time.     Cranial Nerves: No cranial nerve deficit, dysarthria or facial asymmetry.     ED Results / Procedures / Treatments   Labs (all labs ordered are listed, but only abnormal results are displayed) Labs Reviewed  CBC - Abnormal; Notable for the  following components:      Result Value   WBC 11.0 (*)    All other components within normal limits  DIFFERENTIAL - Abnormal; Notable for the following components:   Neutro Abs 8.9 (*)    All other components within normal limits  COMPREHENSIVE METABOLIC PANEL - Abnormal; Notable for the following components:   Potassium 3.3 (*)    Glucose, Bld 135 (*)    BUN 7 (*)    Creatinine, Ser 1.02 (*)    GFR, Estimated 58 (*)    All other components within normal limits  PROTIME-INR  APTT    EKG EKG Interpretation  Date/Time:  Sunday September 05 2020 12:57:55 EDT Ventricular Rate:  89 PR Interval:  142 QRS Duration: 90 QT Interval:  352 QTC Calculation: 428 R Axis:   -10 Text Interpretation: Normal sinus rhythm Inferior infarct , age undetermined Anterior infarct , age undetermined Abnormal ECG No acute changes Confirmed by Varney Biles [phone]) on 09/05/2020 3:28:04 PM   Radiology CT HEAD WO CONTRAST  Result Date: 09/05/2020 CLINICAL DATA:  Altered mental status, headache EXAM: CT HEAD WITHOUT CONTRAST TECHNIQUE: Contiguous axial images were obtained from the base of the skull through the vertex without intravenous contrast. COMPARISON:  Head CT February 20, 2005 FINDINGS: Brain: No evidence of acute large vascular territory infarction, hemorrhage, hydrocephalus, extra-axial collection or mass lesion/mass effect. Increased burden of moderate chronic ischemic small vessel white matter disease. Vascular: No hyperdense vessel. Atherosclerotic calcifications of the internal carotid arteries at skull base. Skull: Normal. Negative for fracture or focal lesion. Sinuses/Orbits: The visualized paranasal sinuses and mastoid air cells are predominantly clear. Orbits are grossly unremarkable. Other: None IMPRESSION: 1. No acute intracranial findings. 2. Increased burden of moderate chronic ischemic small vessel white matter disease. Electronically Signed   By: Dahlia Bailiff MD   On: 09/05/2020 13:34     Procedures Procedures   Medications Ordered in ED Medications  sodium chloride flush (NS) 0.9 % injection 3 mL (has no administration in time range)  metoCLOPramide (REGLAN) injection 10 mg (has no administration in time range)  haloperidol lactate (HALDOL) injection 2 mg (has no administration in time range)    ED Course  I have reviewed the triage vital signs and the nursing notes.  Pertinent labs & imaging results that were available during my care of the patient were reviewed by me and considered in my medical decision making (see chart for details).    MDM Rules/Calculators/A&P                          DDX includes: Primary headaches - including migrainous headaches, cluster headaches, tension headaches. ICH Carotid dissection Cavernous sinus thrombosis Tumor Vascular headaches  AV malformation Brain aneurysm Muscular headaches  A/P: Pt comes in with cc of headaches, neck pain. No associated neurologic symptoms, the headache is not thunderclap in nature.  Waxing and waning intensity, at most 8 out of 10.  Suspicion for subarachnoid hemorrhage, infection is low.  CT angiogram head and neck ordered, if negative for aneurysm, dissection can then she will be discharged with outpatient neuro follow-up.  Patient is aware of the plan.   Final Clinical Impression(s) / ED Diagnoses Final diagnoses:  None    Rx / DC Orders ED Discharge Orders    None       Varney Biles, MD 09/05/20 1539

## 2020-10-07 ENCOUNTER — Encounter: Payer: Self-pay | Admitting: Family

## 2020-10-07 ENCOUNTER — Other Ambulatory Visit: Payer: Self-pay

## 2020-10-07 ENCOUNTER — Telehealth (INDEPENDENT_AMBULATORY_CARE_PROVIDER_SITE_OTHER): Payer: Medicare (Managed Care) | Admitting: Family

## 2020-10-07 VITALS — Ht 64.5 in | Wt 175.0 lb

## 2020-10-07 DIAGNOSIS — U071 COVID-19: Secondary | ICD-10-CM | POA: Diagnosis not present

## 2020-10-07 DIAGNOSIS — R059 Cough, unspecified: Secondary | ICD-10-CM | POA: Diagnosis not present

## 2020-10-07 NOTE — Progress Notes (Signed)
   Virtual Visit via Video   I connected with patient on 10/07/20 at 10:45 AM EDT by a video enabled telemedicine application and verified that I am speaking with the correct person using two identifiers.  Location patient: Home Location provider: [place] participating in the virtual visit: Patient, Provider, Butch Penny, LPN  I discussed the limitations of evaluation and management by telemedicine and the availability of in person appointments. The patient expressed understanding and agreed to proceed.  Subjective:   HPI:   75 year old female presents via video visit after testing positive for COVID-19 2 days ago.  Reports that her symptoms started 5 days ago.  Her daughter and son both are positive for COVID-19.  She has symptoms of a cough and congestion but is doing well otherwise.  She is vaccinated and boosted.  He has been taking over-the-counter medication that has been helping.  No shortness of breath or chest pain  ROS:   See pertinent positives and negatives per HPI.  Patient Active Problem List   Diagnosis Date Noted   Hyperlipidemia 12/17/2017   Essential hypertension 03/10/2015    Social History   Tobacco Use   Smoking status: Never   Smokeless tobacco: Never  Substance Use Topics   Alcohol use: No    Alcohol/week: 0.0 standard drinks    Current Outpatient Medications:    amLODipine (NORVASC) 10 MG tablet, Take 1 tablet by mouth once daily, Disp: 90 tablet, Rfl: 2   aspirin 81 MG tablet, Take 81 mg by mouth daily., Disp: , Rfl:    lisinopril (ZESTRIL) 40 MG tablet, Take 1 tablet (40 mg total) by mouth daily., Disp: 90 tablet, Rfl: 2   lovastatin (MEVACOR) 10 MG tablet, TAKE 1 TABLET BY MOUTH AT BEDTIME (Patient taking differently: Take 10 mg by mouth at bedtime.), Disp: 90 tablet, Rfl: 1   meloxicam (MOBIC) 15 MG tablet, Take 1 tablet (15 mg total) by mouth daily., Disp: 30 tablet, Rfl: 2   MISC NATURAL PRODUCTS PO, Take 1 tablet by  mouth daily. Neuriva, Disp: , Rfl:    Multiple Vitamin (MULTIVITAMIN) capsule, Take 1 capsule by mouth daily., Disp: , Rfl:    spironolactone (ALDACTONE) 25 MG tablet, Take 1 tablet by mouth once daily, Disp: 90 tablet, Rfl: 2  No Known Allergies  Objective:   Ht 5' 4.5" (1.638 m)   Wt 175 lb (79.4 kg)   BMI 29.57 kg/m   Patient is well-developed, well-nourished in no acute distress.  Resting comfortably at home.  Head is normocephalic, atraumatic.  No labored breathing.  Speech is clear and coherent with logical content.  Patient is alert and oriented at baseline.    Assessment and Plan:    Kailan was seen today for covid positive.  Diagnoses and all orders for this visit:  COVID-19  Cough Since patient is into day 5 of COVID-19, no additional intervention provided.  Discussed isolation and quarantine protocols.  Patient aware that if her symptoms persist she will continue to isolate until day 10.  Call the office with any questions or concerns.  He elected not to start any antiviral therapy given what we are in the course of the disease.   Kennyth Arnold, FNP 10/07/2020

## 2020-11-24 ENCOUNTER — Other Ambulatory Visit: Payer: Self-pay | Admitting: Internal Medicine

## 2020-11-24 DIAGNOSIS — I10 Essential (primary) hypertension: Secondary | ICD-10-CM

## 2020-11-29 NOTE — Telephone Encounter (Signed)
Pt called to follow up on refills. She has scheduled a TOC appt for 9/7

## 2020-11-30 NOTE — Telephone Encounter (Signed)
Patient advised that refills were sent in

## 2020-12-04 ENCOUNTER — Other Ambulatory Visit: Payer: Self-pay | Admitting: Internal Medicine

## 2020-12-07 NOTE — Telephone Encounter (Signed)
Last OV- 08/16/2020 Next OV- 12/08/2020 Last Filled- 05/03/2020

## 2020-12-08 ENCOUNTER — Ambulatory Visit (INDEPENDENT_AMBULATORY_CARE_PROVIDER_SITE_OTHER): Payer: Medicare (Managed Care) | Admitting: Nurse Practitioner

## 2020-12-08 ENCOUNTER — Encounter: Payer: Self-pay | Admitting: Nurse Practitioner

## 2020-12-08 ENCOUNTER — Other Ambulatory Visit: Payer: Self-pay

## 2020-12-08 VITALS — BP 122/78 | HR 92 | Temp 96.9°F | Resp 16 | Ht 64.25 in | Wt 191.5 lb

## 2020-12-08 DIAGNOSIS — E7800 Pure hypercholesterolemia, unspecified: Secondary | ICD-10-CM | POA: Diagnosis not present

## 2020-12-08 DIAGNOSIS — Z23 Encounter for immunization: Secondary | ICD-10-CM | POA: Diagnosis not present

## 2020-12-08 DIAGNOSIS — I10 Essential (primary) hypertension: Secondary | ICD-10-CM | POA: Diagnosis not present

## 2020-12-08 LAB — LIPID PANEL
Cholesterol: 173 mg/dL (ref 0–200)
HDL: 45 mg/dL (ref >39.00)
LDL Cholesterol: 104 mg/dL — ABNORMAL HIGH (ref 0–99)
NonHDL: 128.01
Total CHOL/HDL Ratio: 4
Triglycerides: 122 mg/dL (ref 0.0–149.0)
VLDL: 24.4 mg/dL (ref 0.0–40.0)

## 2020-12-08 LAB — COMPREHENSIVE METABOLIC PANEL
ALT: 15 U/L (ref 0–35)
AST: 23 U/L (ref 0–37)
Albumin: 4.1 g/dL (ref 3.5–5.2)
Alkaline Phosphatase: 50 U/L (ref 39–117)
BUN: 11 mg/dL (ref 6–23)
CO2: 30 mEq/L (ref 19–32)
Calcium: 10.3 mg/dL (ref 8.4–10.5)
Chloride: 101 mEq/L (ref 96–112)
Creatinine, Ser: 0.89 mg/dL (ref 0.40–1.20)
GFR: 64.2 mL/min (ref >60.00)
Glucose, Bld: 114 mg/dL — ABNORMAL HIGH (ref 70–99)
Potassium: 3.5 mEq/L (ref 3.5–5.1)
Sodium: 141 mEq/L (ref 135–145)
Total Bilirubin: 0.4 mg/dL (ref 0.2–1.2)
Total Protein: 7.8 g/dL (ref 6.0–8.3)

## 2020-12-08 LAB — CBC
HCT: 40.8 % (ref 36.0–46.0)
Hemoglobin: 13.1 g/dL (ref 12.0–15.0)
MCHC: 32 g/dL (ref 30.0–36.0)
MCV: 87.3 fl (ref 78.0–100.0)
Platelets: 250 10*3/uL (ref 150.0–400.0)
RBC: 4.67 Mil/uL (ref 3.87–5.11)
RDW: 13.8 % (ref 11.5–15.5)
WBC: 8.1 10*3/uL (ref 4.0–10.5)

## 2020-12-08 MED ORDER — LOVASTATIN 10 MG PO TABS
10.0000 mg | ORAL_TABLET | Freq: Every day | ORAL | 3 refills | Status: DC
Start: 2020-12-08 — End: 2022-02-16

## 2020-12-08 NOTE — Patient Instructions (Signed)
Nice to see you Will see you in approx 3 months for annual wellness visit.

## 2020-12-08 NOTE — Progress Notes (Signed)
Established Patient Office Visit  Subjective:  Patient ID: Teresa Fry, female    DOB: Jan 24, 1946  Age: 75 y.o. MRN: [id number]  CC:  Chief Complaint  Patient presents with   Transfer of Care   Medication Refill    HPI Teresa Fry presents for transfer of care  HTN: take bp once daily at home. Does try to avid friend and salty foods.2 times weekly: she will do situps and jumping jacks take approx 30 minutes.  HLD: Currently maintained on lovastatin. Tolerates medication well. Last LDL was Jan 2022. Does do exercises a couple times weekly.  Past Medical History:  Diagnosis Date   Gallstones    Hypertension     Past Surgical History:  Procedure Laterality Date   CHOLECYSTECTOMY  02/03/11    Family History  Problem Relation Age of Onset   Heart disease Father    Cancer Daughter        breast    Social History   Socioeconomic History   Marital status: Married    Spouse name: Not on file   Number of children: Not on file   Years of education: Not on file   Highest education level: Not on file  Occupational History   Not on file  Tobacco Use   Smoking status: Never   Smokeless tobacco: Never  Substance and Sexual Activity   Alcohol use: No    Alcohol/week: 0.0 standard drinks   Drug use: No   Sexual activity: Never  Other Topics Concern   Not on file  Social History Narrative   Not on file   Social Determinants of Health   Financial Resource Strain: Not on file  Food Insecurity: Not on file  Transportation Needs: Not on file  Physical Activity: Not on file  Stress: Not on file  Social Connections: Not on file  Intimate Partner Violence: Not on file    Outpatient Medications Prior to Visit  Medication Sig Dispense Refill   amLODipine (NORVASC) 10 MG tablet Take 1 tablet by mouth once daily 90 tablet 0   aspirin 81 MG tablet Take 81 mg by mouth daily.     lisinopril (ZESTRIL) 40 MG tablet Take 1 tablet by mouth once daily 90 tablet 0   lovastatin  (MEVACOR) 10 MG tablet TAKE 1 TABLET BY MOUTH AT BEDTIME 90 tablet 0   MISC NATURAL PRODUCTS PO Take 1 tablet by mouth daily. Neuriva     Multiple Vitamin (MULTIVITAMIN) capsule Take 1 capsule by mouth daily.     spironolactone (ALDACTONE) 25 MG tablet Take 1 tablet by mouth once daily 90 tablet 0   meloxicam (MOBIC) 15 MG tablet Take 1 tablet (15 mg total) by mouth daily. 30 tablet 2   No facility-administered medications prior to visit.    No Known Allergies  ROS Review of Systems  Constitutional:  Negative for chills, fatigue and fever.  Respiratory:  Negative for shortness of breath.   Cardiovascular:  Negative for chest pain, palpitations and leg swelling.  Gastrointestinal:  Negative for abdominal pain, blood in stool, diarrhea, nausea and vomiting.  Genitourinary:  Negative for dysuria and hematuria.  Skin:  Negative for color change and pallor.  Neurological:  Positive for headaches (hx but no current headaches). Negative for dizziness and light-headedness.  Psychiatric/Behavioral:  Negative for hallucinations and suicidal ideas.      Objective:    Physical Exam Vitals and nursing note reviewed.  Constitutional:      Appearance: She is obese.  HENT:     Right Ear: Tympanic membrane, ear canal and external ear normal.     Left Ear: Tympanic membrane, ear canal and external ear normal.     Mouth/Throat:     Mouth: Mucous membranes are moist.     Pharynx: Oropharynx is clear.  Eyes:     Extraocular Movements: Extraocular movements intact.     Pupils: Pupils are equal, round, and reactive to light.  Neck:     Thyroid: No thyroid mass, thyromegaly or thyroid tenderness.     Vascular: No carotid bruit.  Cardiovascular:     Rate and Rhythm: Normal rate and regular rhythm.     Pulses:          Radial pulses are 2+ on the right side and 2+ on the left side.       Dorsalis pedis pulses are 2+ on the right side and 2+ on the left side.  Pulmonary:     Effort: Pulmonary  effort is normal.     Breath sounds: Normal breath sounds.  Abdominal:     General: Bowel sounds are normal.  Lymphadenopathy:     Cervical: No cervical adenopathy.  Skin:    General: Skin is warm.  Neurological:     General: No focal deficit present.     Mental Status: She is alert.     Motor: No weakness.     Coordination: Coordination normal.     Gait: Gait normal.  Psychiatric:        Mood and Affect: Mood normal.        Behavior: Behavior normal.        Thought Content: Thought content normal.        Judgment: Judgment normal.    BP 122/78   Pulse 92   Temp (!) 96.9 F (36.1 C)   Resp 16   Ht 5' 4.25" (1.632 m)   Wt 191 lb 8 oz (86.9 kg)   SpO2 95%   BMI 32.62 kg/m  Wt Readings from Last 3 Encounters:  12/08/20 191 lb 8 oz (86.9 kg)  10/07/20 175 lb (79.4 kg)  08/16/20 191 lb (86.6 kg)     Health Maintenance Due  Topic Date Due   TETANUS/TDAP  Never done   MAMMOGRAM  Never done   Zoster Vaccines- Shingrix (1 of 2) Never done   DEXA SCAN  Never done   COVID-19 Vaccine (4 - Booster for Pfizer series) 07/31/2020   INFLUENZA VACCINE  11/01/2020    There are no preventive care reminders to display for this patient.  Lab Results  Component Value Date   TSH 1.60 08/09/2016   Lab Results  Component Value Date   WBC 11.0 (H) 09/05/2020   HGB 13.8 09/05/2020   HCT 43.0 09/05/2020   MCV 88.7 09/05/2020   PLT 293 09/05/2020   Lab Results  Component Value Date   NA 142 09/05/2020   K 3.3 (L) 09/05/2020   CO2 29 09/05/2020   GLUCOSE 135 (H) 09/05/2020   BUN 7 (L) 09/05/2020   CREATININE 1.02 (H) 09/05/2020   BILITOT 0.5 09/05/2020   ALKPHOS 63 09/05/2020   AST 23 09/05/2020   ALT 16 09/05/2020   PROT 7.9 09/05/2020   ALBUMIN 4.0 09/05/2020   CALCIUM 9.7 09/05/2020   ANIONGAP 10 09/05/2020   GFR 51.69 (L) 04/27/2020   Lab Results  Component Value Date   CHOL 171 04/27/2020   Lab Results  Component Value Date   HDL  44.10 04/27/2020   Lab  Results  Component Value Date   LDLCALC 106 (H) 04/27/2020   Lab Results  Component Value Date   TRIG 103.0 04/27/2020   Lab Results  Component Value Date   CHOLHDL 4 04/27/2020   Lab Results  Component Value Date   HGBA1C 5.6 12/05/2019      Assessment & Plan:   Problem List Items Addressed This Visit       Cardiovascular and Mediastinum   Essential hypertension - Primary    Patient stable on current regimen.  Patient currently maintained on lisinopril, amlodipine, spironolactone.  Patient does check blood pressure once daily at home per report within normal limits.  Continue working on diet and exercise in conjunction with medication.  Continue lisinopril, amlodipine, spironolactone.  Pending lab results      Relevant Medications   lovastatin (MEVACOR) 10 MG tablet   Other Relevant Orders   CBC   Comprehensive metabolic panel     Other   Hyperlipidemia    Currently maintained on lovastatin.  Patient continue to work on exercise and lifestyle modifications.  We will check lipids in office today.  Pending lab results.  Continue lovastatin as prescribed, refill sent to pharmacy today.      Relevant Medications   lovastatin (MEVACOR) 10 MG tablet   Other Relevant Orders   Lipid panel   Other Visit Diagnoses     Need for influenza vaccination       Relevant Orders   Flu Vaccine QUAD High Dose(Fluad) (Completed)       No orders of the defined types were placed in this encounter.   Follow-up: Return in about 3 months (around 03/09/2021) for Annual wellness visit.   This visit occurred during the SARS-CoV-2 public health emergency.  Safety protocols were in place, including screening questions prior to the visit, additional usage of staff PPE, and extensive cleaning of exam room while observing appropriate contact time as indicated for disinfecting solutions.   Romilda Garret, NP

## 2020-12-08 NOTE — Assessment & Plan Note (Signed)
Patient stable on current regimen.  Patient currently maintained on lisinopril, amlodipine, spironolactone.  Patient does check blood pressure once daily at home per report within normal limits.  Continue working on diet and exercise in conjunction with medication.  Continue lisinopril, amlodipine, spironolactone.  Pending lab results

## 2020-12-08 NOTE — Assessment & Plan Note (Signed)
Currently maintained on lovastatin.  Patient continue to work on exercise and lifestyle modifications.  We will check lipids in office today.  Pending lab results.  Continue lovastatin as prescribed, refill sent to pharmacy today.

## 2021-03-03 ENCOUNTER — Other Ambulatory Visit: Payer: Self-pay | Admitting: Family

## 2021-03-03 DIAGNOSIS — I10 Essential (primary) hypertension: Secondary | ICD-10-CM

## 2021-05-17 ENCOUNTER — Other Ambulatory Visit: Payer: Self-pay | Admitting: Nurse Practitioner

## 2021-05-17 DIAGNOSIS — I10 Essential (primary) hypertension: Secondary | ICD-10-CM

## 2021-05-18 ENCOUNTER — Telehealth: Payer: Self-pay

## 2021-05-18 NOTE — Telephone Encounter (Signed)
Please schedule patient for Medicare wellness physical with Matt at anytime. Thank you

## 2021-05-20 NOTE — Telephone Encounter (Signed)
LMTCB to scheduled

## 2021-06-17 ENCOUNTER — Other Ambulatory Visit: Payer: Self-pay | Admitting: Nurse Practitioner

## 2021-06-17 DIAGNOSIS — I10 Essential (primary) hypertension: Secondary | ICD-10-CM

## 2021-06-17 NOTE — Telephone Encounter (Signed)
Left message to call back to schedule.

## 2021-06-20 ENCOUNTER — Encounter: Payer: Self-pay | Admitting: Nurse Practitioner

## 2021-06-20 NOTE — Telephone Encounter (Signed)
Called patient a month ago also, letter mailed ?

## 2021-07-01 ENCOUNTER — Ambulatory Visit: Payer: Medicare (Managed Care) | Admitting: Nurse Practitioner

## 2021-07-01 ENCOUNTER — Ambulatory Visit (INDEPENDENT_AMBULATORY_CARE_PROVIDER_SITE_OTHER)
Admission: RE | Admit: 2021-07-01 | Discharge: 2021-07-01 | Disposition: A | Discharge status: Home / Self Care | Payer: Medicare (Managed Care) | Source: Ambulatory Visit | Attending: Nurse Practitioner | Admitting: Nurse Practitioner

## 2021-07-01 ENCOUNTER — Encounter: Payer: Self-pay | Admitting: Nurse Practitioner

## 2021-07-01 VITALS — BP 130/88 | HR 92 | Temp 97.5°F | Resp 14 | Ht 64.25 in | Wt 200.5 lb

## 2021-07-01 DIAGNOSIS — M25511 Pain in right shoulder: Secondary | ICD-10-CM | POA: Diagnosis not present

## 2021-07-01 DIAGNOSIS — G8929 Other chronic pain: Secondary | ICD-10-CM

## 2021-07-01 MED ORDER — DICLOFENAC SODIUM 1 % EX GEL: 2.0000 g | Freq: Three times a day (TID) | CUTANEOUS | 0 refills | Status: AC | PRN

## 2021-07-01 NOTE — Patient Instructions (Signed)
Nice to see you today ?Voltaren Gel is what we will use to help with the shoulder also try using some tylenol as directed on the bottle.  ?If the xray looks ok and you continue to have pain we will get you back in with Dr. Lorelei Pont for an injection and further testing ?Schedule your physcial with me in 1-2 months ?

## 2021-07-01 NOTE — Assessment & Plan Note (Signed)
Acute on chronic right shoulder pain.  No recent injury per patient report.  Has been going on in the masturbated state for approximate 6 months.  Will obtain x-ray of right shoulder.  Patient to try Tylenol and send in some Voltaren gel.  If x-ray normal and symptoms abate she can follow-up with Dr. Frederico Hamman Copland for reevaluation and possible joint injection. ?

## 2021-07-01 NOTE — Progress Notes (Signed)
? ?Acute Office Visit ? ?Subjective:  ? ? Patient ID: Teresa Fry, female    DOB: 01-20-1946, 76 y.o.   MRN: [id number] ? ?Chief Complaint  ?Patient presents with  ? Shoulder Pain  ?  Right shoulder, x few years. Bothering her more at night. Sometimes feels like it is locking up in place. Pain goes down right wrist also  ? ? ? ?Patient is in today for  right Shoulder pain  ? ?Right shoulder pain for the past couple years. States that she has fallen in past and evaluated by Dr Lorelei Pont. States that she has had an injection and that helped. Was cleared to come back on a prn basis ?Started getting worse over the past 6 months, per patient report. No new or recent injury ?Dull aching pain that is intermittent, worse at night. She has tried ibuprofen without great relief. Will put muscle rub on without great relief. ? ?Past Medical History:  ?Diagnosis Date  ? Gallstones   ? Hypertension   ? ? ?Past Surgical History:  ?Procedure Laterality Date  ? CHOLECYSTECTOMY  02/03/11  ? ? ?Family History  ?Problem Relation Age of Onset  ? Heart disease Father   ? Cancer Daughter   ?     breast  ? ? ?Social History  ? ?Socioeconomic History  ? Marital status: Married  ?  Spouse name: Not on file  ? Number of children: Not on file  ? Years of education: Not on file  ? Highest education level: Not on file  ?Occupational History  ? Not on file  ?Tobacco Use  ? Smoking status: Never  ? Smokeless tobacco: Never  ?Substance and Sexual Activity  ? Alcohol use: No  ?  Alcohol/week: 0.0 standard drinks  ? Drug use: No  ? Sexual activity: Never  ?Other Topics Concern  ? Not on file  ?Social History Narrative  ? Not on file  ? ?Social Determinants of Health  ? ?Financial Resource Strain: Not on file  ?Food Insecurity: Not on file  ?Transportation Needs: Not on file  ?Physical Activity: Not on file  ?Stress: Not on file  ?Social Connections: Not on file  ?Intimate Partner Violence: Not on file  ? ? ?Outpatient Medications Prior to Visit   ?Medication Sig Dispense Refill  ? amLODipine (NORVASC) 10 MG tablet Take 1 tablet by mouth once daily 90 tablet 0  ? aspirin 81 MG tablet Take 81 mg by mouth daily.    ? lisinopril (ZESTRIL) 40 MG tablet Take 1 tablet by mouth once daily 90 tablet 0  ? lovastatin (MEVACOR) 10 MG tablet Take 1 tablet (10 mg total) by mouth at bedtime. 90 tablet 3  ? MISC NATURAL PRODUCTS PO Take 1 tablet by mouth daily. Neuriva    ? Multiple Vitamin (MULTIVITAMIN) capsule Take 1 capsule by mouth daily.    ? spironolactone (ALDACTONE) 25 MG tablet Take 1 tablet by mouth once daily 90 tablet 0  ? ?No facility-administered medications prior to visit.  ? ? ?No Known Allergies ? ?Review of Systems  ?Constitutional:  Negative for chills and fever.  ?Musculoskeletal:  Positive for arthralgias. Negative for joint swelling.  ?Neurological:  Positive for weakness. Negative for numbness.  ? ?   ?Objective:  ?  ?Physical Exam ?Vitals and nursing note reviewed.  ?Constitutional:   ?   Appearance: Normal appearance.  ?Cardiovascular:  ?   Rate and Rhythm: Normal rate and regular rhythm.  ?   Heart sounds: Normal heart  sounds.  ?Pulmonary:  ?   Effort: Pulmonary effort is normal.  ?   Breath sounds: Normal breath sounds.  ?Musculoskeletal:     ?   General: Tenderness present. No signs of injury.  ?   Right shoulder: No swelling or deformity. Normal range of motion. Normal strength. Normal pulse.  ?   Comments: Pain with anterior lifting ?Pain with abduction ?Slight pulling with hawkins and empty can  ?Neurological:  ?   Mental Status: She is alert.  ? ? ?BP 130/88   Pulse 92   Temp (!) 97.5 ?F (36.4 ?C)   Resp 14   Ht 5' 4.25" (1.632 m)   Wt 200 lb 8 oz (90.9 kg)   SpO2 95%   BMI 34.15 kg/m?  ?Wt Readings from Last 3 Encounters:  ?07/01/21 200 lb 8 oz (90.9 kg)  ?12/08/20 191 lb 8 oz (86.9 kg)  ?10/07/20 175 lb (79.4 kg)  ? ? ?Health Maintenance Due  ?Topic Date Due  ? TETANUS/TDAP  Never done  ? MAMMOGRAM  Never done  ? Zoster Vaccines-  Shingrix (1 of 2) Never done  ? DEXA SCAN  Never done  ? COVID-19 Vaccine (4 - Booster for Pfizer series) 05/27/2020  ? ? ?There are no preventive care reminders to display for this patient. ? ? ?Lab Results  ?Component Value Date  ? TSH 1.60 08/09/2016  ? ?Lab Results  ?Component Value Date  ? WBC 8.1 12/08/2020  ? HGB 13.1 12/08/2020  ? HCT 40.8 12/08/2020  ? MCV 87.3 12/08/2020  ? PLT 250.0 12/08/2020  ? ?Lab Results  ?Component Value Date  ? NA 141 12/08/2020  ? K 3.5 12/08/2020  ? CO2 30 12/08/2020  ? GLUCOSE 114 (H) 12/08/2020  ? BUN 11 12/08/2020  ? CREATININE 0.89 12/08/2020  ? BILITOT 0.4 12/08/2020  ? ALKPHOS 50 12/08/2020  ? AST 23 12/08/2020  ? ALT 15 12/08/2020  ? PROT 7.8 12/08/2020  ? ALBUMIN 4.1 12/08/2020  ? CALCIUM 10.3 12/08/2020  ? ANIONGAP 10 09/05/2020  ? GFR 64.20 12/08/2020  ? ?Lab Results  ?Component Value Date  ? CHOL 173 12/08/2020  ? ?Lab Results  ?Component Value Date  ? HDL 45.00 12/08/2020  ? ?Lab Results  ?Component Value Date  ? LDLCALC 104 (H) 12/08/2020  ? ?Lab Results  ?Component Value Date  ? TRIG 122.0 12/08/2020  ? ?Lab Results  ?Component Value Date  ? CHOLHDL 4 12/08/2020  ? ?Lab Results  ?Component Value Date  ? HGBA1C 5.6 12/05/2019  ? ? ?   ?Assessment & Plan:  ? ?Problem List Items Addressed This Visit   ? ?  ? Other  ? Chronic right shoulder pain - Primary  ?  Acute on chronic right shoulder pain.  No recent injury per patient report.  Has been going on in the masturbated state for approximate 6 months.  Will obtain x-ray of right shoulder.  Patient to try Tylenol and send in some Voltaren gel.  If x-ray normal and symptoms abate she can follow-up with Dr. Frederico Hamman Copland for reevaluation and possible joint injection. ?  ?  ? Relevant Medications  ? diclofenac Sodium (VOLTAREN) 1 % GEL  ? Other Relevant Orders  ? DG Shoulder Right  ? ? ? ?Meds ordered this encounter  ?Medications  ? diclofenac Sodium (VOLTAREN) 1 % GEL  ?  Sig: Apply 2 g topically 3 (three) times daily  as needed. To your shoulder  ?  Dispense:  50  g  ?  Refill:  0  ?  Order Specific Question:   Supervising Provider  ?  Answer:   Loura Pardon A [[id number]]  ? ? ? ?Romilda Garret, NP ? ?

## 2021-08-08 ENCOUNTER — Ambulatory Visit (INDEPENDENT_AMBULATORY_CARE_PROVIDER_SITE_OTHER): Payer: Medicare (Managed Care)

## 2021-08-08 VITALS — Ht 64.0 in | Wt 180.0 lb

## 2021-08-08 DIAGNOSIS — Z0000 Encounter for general adult medical examination without abnormal findings: Secondary | ICD-10-CM | POA: Diagnosis not present

## 2021-08-08 NOTE — Progress Notes (Signed)
?I connected with Teresa Fry today by telephone and verified that I am speaking with the correct person using two identifiers. ?Location patient: home ?Location provider: work ?Persons participating in the virtual visit: Vi, Biddinger LPN. ?  ?I discussed the limitations, risks, security and privacy concerns of performing an evaluation and management service by telephone and the availability of in person appointments. I also discussed with the patient that there may be a patient responsible charge related to this service. The patient expressed understanding and verbally consented to this telephonic visit.  ?  ?Interactive audio and video telecommunications were attempted between this provider and patient, however failed, due to patient having technical difficulties OR patient did not have access to video capability.  We continued and completed visit with audio only. ? ?  ? ?Vital signs may be patient reported or missing. ? ?Subjective:  ? Teresa Fry is a 76 y.o. female who presents for Medicare Annual (Subsequent) preventive examination. ? ?Review of Systems    ? ?Cardiac Risk Factors include: advanced age (>63mn, >>23women);dyslipidemia;hypertension;obesity (BMI >30kg/m2) ? ?   ?Objective:  ?  ?Today's Vitals  ? 08/08/21 1426  ?Weight: 180 lb (81.6 kg)  ?Height: '5\' 4"'$  (1.626 m)  ? ?Body mass index is 30.9 kg/m?. ? ? ?  08/08/2021  ?  2:30 PM  ?Advanced Directives  ?Does Patient Have a Medical Advance Directive? No  ? ? ?Current Medications (verified) ?Outpatient Encounter Medications as of 08/08/2021  ?Medication Sig  ? amLODipine (NORVASC) 10 MG tablet Take 1 tablet by mouth once daily  ? aspirin 81 MG tablet Take 81 mg by mouth daily.  ? diclofenac Sodium (VOLTAREN) 1 % GEL Apply 2 g topically 3 (three) times daily as needed. To your shoulder  ? lisinopril (ZESTRIL) 40 MG tablet Take 1 tablet by mouth once daily  ? lovastatin (MEVACOR) 10 MG tablet Take 1 tablet (10 mg total) by mouth at bedtime.  ?  MISC NATURAL PRODUCTS PO Take 1 tablet by mouth daily. Neuriva  ? Multiple Vitamin (MULTIVITAMIN) capsule Take 1 capsule by mouth daily.  ? spironolactone (ALDACTONE) 25 MG tablet Take 1 tablet by mouth once daily  ? ?No facility-administered encounter medications on file as of 08/08/2021.  ? ? ?Allergies (verified) ?Patient has no known allergies.  ? ?History: ?Past Medical History:  ?Diagnosis Date  ? Gallstones   ? Hypertension   ? ?Past Surgical History:  ?Procedure Laterality Date  ? CHOLECYSTECTOMY  02/03/11  ? ?Family History  ?Problem Relation Age of Onset  ? Heart disease Father   ? Cancer Daughter   ?     breast  ? ?Social History  ? ?Socioeconomic History  ? Marital status: Married  ?  Spouse name: Not on file  ? Number of children: Not on file  ? Years of education: Not on file  ? Highest education level: Not on file  ?Occupational History  ? Not on file  ?Tobacco Use  ? Smoking status: Never  ? Smokeless tobacco: Never  ?Vaping Use  ? Vaping Use: Never used  ?Substance and Sexual Activity  ? Alcohol use: No  ?  Alcohol/week: 0.0 standard drinks  ? Drug use: No  ? Sexual activity: Never  ?Other Topics Concern  ? Not on file  ?Social History Narrative  ? Not on file  ? ?Social Determinants of Health  ? ?Financial Resource Strain: Low Risk   ? Difficulty of Paying Living Expenses: Not hard at all  ?  Food Insecurity: No Food Insecurity  ? Worried About Charity fundraiser in the Last Year: Never true  ? Ran Out of Food in the Last Year: Never true  ?Transportation Needs: No Transportation Needs  ? Lack of Transportation (Medical): No  ? Lack of Transportation (Non-Medical): No  ?Physical Activity: Insufficiently Active  ? Days of Exercise per Week: 2 days  ? Minutes of Exercise per Session: 20 min  ?Stress: No Stress Concern Present  ? Feeling of Stress : Not at all  ?Social Connections: Not on file  ? ? ?Tobacco Counseling ?Counseling given: Not Answered ? ? ?Clinical Intake: ? ?Pre-visit preparation  completed: Yes ? ?Pain : No/denies pain ? ?  ? ?Nutritional Status: BMI > 30  Obese ?Nutritional Risks: None ?Diabetes: No ? ?How often do you need to have someone help you when you read instructions, pamphlets, or other written materials from your doctor or pharmacy?: 1 - Never ?What is the last grade level you completed in school?: 73yrcollege ? ?Diabetic? no ? ?Interpreter Needed?: No ? ?Information entered by :: NAllen LPN ? ? ?Activities of Daily Living ? ?  08/08/2021  ?  2:32 PM 08/16/2020  ?  9:47 AM  ?In your present state of health, do you have any difficulty performing the following activities:  ?Hearing? 0 0  ?Vision? 0 0  ?Difficulty concentrating or making decisions? 1 0  ?Walking or climbing stairs? 0 0  ?Dressing or bathing? 0 0  ?Doing errands, shopping? 0 0  ?Preparing Food and eating ? N   ?Using the Toilet? N   ?In the past six months, have you accidently leaked urine? N   ?Do you have problems with loss of bowel control? N   ?Managing your Medications? N   ?Managing your Finances? N   ?Housekeeping or managing your Housekeeping? N   ? ? ?Patient Care Team: ?CMichela Pitcher NP as PCP - General ? ?Indicate any recent Medical Services you may have received from other than [facility] providers in the past year (date may be approximate). ? ?   ?Assessment:  ? This is a routine wellness examination for Teresa Fry. ? ?Hearing/Vision screen ?Vision Screening - Comments:: No regular eye exams ? ?Dietary issues and exercise activities discussed: ?Current Exercise Habits: Home exercise routine, Type of exercise: Other - see comments (stationary bike), Time (Minutes): 20, Frequency (Times/Week): 2, Weekly Exercise (Minutes/Week): 40 ? ? Goals Addressed   ? ?  ?  ?  ?  ? This Visit's Progress  ?  Patient Stated     ?  08/08/2021, wants to lose weight ?  ? ?  ? ?Depression Screen ? ?  08/08/2021  ?  2:31 PM 12/08/2020  ? 11:53 AM 12/02/2019  ?  2:16 PM 11/07/2018  ?  2:36 PM 06/19/2018  ?  3:09 PM 02/20/2017  ?  3:43 PM 01/31/2017  ?   4:13 PM  ?PHQ 2/9 Scores  ?PHQ - 2 Score 0 0 0 0 0 0 0  ?PHQ- 9 Score    0     ?  ?Fall Risk ? ?  08/08/2021  ?  2:31 PM 12/08/2020  ? 11:51 AM 12/02/2019  ?  2:19 PM 12/02/2019  ?  2:16 PM 11/07/2018  ?  2:37 PM  ?Fall Risk   ?Falls in the past year? 0 0 0 0 0  ?Number falls in past yr: 0 0 0    ?Injury with Fall? 0 0     ?  Risk for fall due to : Medication side effect      ?Follow up Falls evaluation completed;Education provided;Falls prevention discussed      ? ? ?FALL RISK PREVENTION PERTAINING TO THE HOME: ? ?Any stairs in or around the home? Yes  ?If so, are there any without handrails? No  ?Home free of loose throw rugs in walkways, pet beds, electrical cords, etc? Yes  ?Adequate lighting in your home to reduce risk of falls? Yes  ? ?ASSISTIVE DEVICES UTILIZED TO PREVENT FALLS: ? ?Life alert? No  ?Use of a cane, walker or w/c? No  ?Grab bars in the bathroom? Yes  ?Shower chair or bench in shower? No  ?Elevated toilet seat or a handicapped toilet? Yes  ? ?TIMED UP AND GO: ? ?Was the test performed? No .  ?  ? ? ? ?Cognitive Function: ?  ?  ? ?  08/08/2021  ?  2:33 PM  ?6CIT Screen  ?What Year? 0 points  ?What month? 0 points  ?What time? 0 points  ?Count back from 20 0 points  ?Months in reverse 0 points  ?Repeat phrase 0 points  ?Total Score 0 points  ? ? ?Immunizations ?Immunization History  ?Administered Date(s) Administered  ? Fluad Quad(high Dose 65+) 12/08/2020  ? Influenza, High Dose Seasonal PF 03/12/2018  ? Influenza,inj,Quad PF,6+ Mos 01/31/2017  ? PFIZER(Purple Top)SARS-COV-2 Vaccination 09/02/2019, 09/23/2019, 04/01/2020  ? Pneumococcal Conjugate-13 02/20/2017  ? Pneumococcal Polysaccharide-23 11/07/2018  ? ? ?TDAP status: Due, Education has been provided regarding the importance of this vaccine. Advised may receive this vaccine at local pharmacy or Health Dept. Aware to provide a copy of the vaccination record if obtained from local pharmacy or Health Dept. Verbalized acceptance and understanding. ? ?Flu  Vaccine status: Up to date ? ?Pneumococcal vaccine status: Up to date ? ?Covid-19 vaccine status: Completed vaccines ? ?Qualifies for Shingles Vaccine? Yes   ?Zostavax completed No   ?Shingrix Completed?: No.

## 2021-08-08 NOTE — Patient Instructions (Signed)
Teresa Fry , ?Thank you for taking time to come for your Medicare Wellness Visit. I appreciate your ongoing commitment to your health goals. Please review the following plan we discussed and let me know if I can assist you in the future.  ? ?Screening recommendations/referrals: ?Colonoscopy: states had since cologuard ?Mammogram: decline ?Bone Density: due ?Recommended yearly ophthalmology/optometry visit for glaucoma screening and checkup ?Recommended yearly dental visit for hygiene and checkup ? ?Vaccinations: ?Influenza vaccine: due 11/01/2021 ?Pneumococcal vaccine: completed 11/07/2018 ?Tdap vaccine: due ?Shingles vaccine: discussed   ?Covid-19: 04/01/2020, 09/23/2019, 09/02/2019 ? ?Advanced directives: Advance directive discussed with you today.  ? ?Conditions/risks identified: none ? ?Next appointment: Follow up in one year for your annual wellness visit  ? ? ?Preventive Care 41 Years and Older, Female ?Preventive care refers to lifestyle choices and visits with your health care provider that can promote health and wellness. ?What does preventive care include? ?A yearly physical exam. This is also called an annual well check. ?Dental exams once or twice a year. ?Routine eye exams. Ask your health care provider how often you should have your eyes checked. ?Personal lifestyle choices, including: ?Daily care of your teeth and gums. ?Regular physical activity. ?Eating a healthy diet. ?Avoiding tobacco and drug use. ?Limiting alcohol use. ?Practicing safe sex. ?Taking low-dose aspirin every day. ?Taking vitamin and mineral supplements as recommended by your health care provider. ?What happens during an annual well check? ?The services and screenings done by your health care provider during your annual well check will depend on your age, overall health, lifestyle risk factors, and family history of disease. ?Counseling  ?Your health care provider may ask you questions about your: ?Alcohol use. ?Tobacco use. ?Drug  use. ?Emotional well-being. ?Home and relationship well-being. ?Sexual activity. ?Eating habits. ?History of falls. ?Memory and ability to understand (cognition). ?Work and work Statistician. ?Reproductive health. ?Screening  ?You may have the following tests or measurements: ?Height, weight, and BMI. ?Blood pressure. ?Lipid and cholesterol levels. These may be checked every 5 years, or more frequently if you are over 75 years old. ?Skin check. ?Lung cancer screening. You may have this screening every year starting at age 60 if you have a 30-pack-year history of smoking and currently smoke or have quit within the past 15 years. ?Fecal occult blood test (FOBT) of the stool. You may have this test every year starting at age 71. ?Flexible sigmoidoscopy or colonoscopy. You may have a sigmoidoscopy every 5 years or a colonoscopy every 10 years starting at age 70. ?Hepatitis C blood test. ?Hepatitis B blood test. ?Sexually transmitted disease (STD) testing. ?Diabetes screening. This is done by checking your blood sugar (glucose) after you have not eaten for a while (fasting). You may have this done every 1-3 years. ?Bone density scan. This is done to screen for osteoporosis. You may have this done starting at age 16. ?Mammogram. This may be done every 1-2 years. Talk to your health care provider about how often you should have regular mammograms. ?Talk with your health care provider about your test results, treatment options, and if necessary, the need for more tests. ?Vaccines  ?Your health care provider may recommend certain vaccines, such as: ?Influenza vaccine. This is recommended every year. ?Tetanus, diphtheria, and acellular pertussis (Tdap, Td) vaccine. You may need a Td booster every 10 years. ?Zoster vaccine. You may need this after age 4. ?Pneumococcal 13-valent conjugate (PCV13) vaccine. One dose is recommended after age 35. ?Pneumococcal polysaccharide (PPSV23) vaccine. One dose is recommended after  age  49. ?Talk to your health care provider about which screenings and vaccines you need and how often you need them. ?This information is not intended to replace advice given to you by your health care provider. Make sure you discuss any questions you have with your health care provider. ?Document Released: 04/16/2015 Document Revised: 12/08/2015 Document Reviewed: 01/19/2015 ?Elsevier Interactive Patient Education ? 2017 [place]. ? ?Fall Prevention in the Home ?Falls can cause injuries. They can happen to people of all ages. There are many things you can do to make your home safe and to help prevent falls. ?What can I do on the outside of my home? ?Regularly fix the edges of walkways and driveways and fix any cracks. ?Remove anything that might make you trip as you walk through a door, such as a raised step or threshold. ?Trim any bushes or trees on the path to your home. ?Use bright outdoor lighting. ?Clear any walking paths of anything that might make someone trip, such as rocks or tools. ?Regularly check to see if handrails are loose or broken. Make sure that both sides of any steps have handrails. ?Any raised decks and porches should have guardrails on the edges. ?Have any leaves, snow, or ice cleared regularly. ?Use sand or salt on walking paths during winter. ?Clean up any spills in your garage right away. This includes oil or grease spills. ?What can I do in the bathroom? ?Use night lights. ?Install grab bars by the toilet and in the tub and shower. Do not use towel bars as grab bars. ?Use non-skid mats or decals in the tub or shower. ?If you need to sit down in the shower, use a plastic, non-slip stool. ?Keep the floor dry. Clean up any water that spills on the floor as soon as it happens. ?Remove soap buildup in the tub or shower regularly. ?Attach bath mats securely with double-sided non-slip rug tape. ?Do not have throw rugs and other things on the floor that can make you trip. ?What can I do in the  bedroom? ?Use night lights. ?Make sure that you have a light by your bed that is easy to reach. ?Do not use any sheets or blankets that are too big for your bed. They should not hang down onto the floor. ?Have a firm chair that has side arms. You can use this for support while you get dressed. ?Do not have throw rugs and other things on the floor that can make you trip. ?What can I do in the kitchen? ?Clean up any spills right away. ?Avoid walking on wet floors. ?Keep items that you use a lot in easy-to-reach places. ?If you need to reach something above you, use a strong step stool that has a grab bar. ?Keep electrical cords out of the way. ?Do not use floor polish or wax that makes floors slippery. If you must use wax, use non-skid floor wax. ?Do not have throw rugs and other things on the floor that can make you trip. ?What can I do with my stairs? ?Do not leave any items on the stairs. ?Make sure that there are handrails on both sides of the stairs and use them. Fix handrails that are broken or loose. Make sure that handrails are as long as the stairways. ?Check any carpeting to make sure that it is firmly attached to the stairs. Fix any carpet that is loose or worn. ?Avoid having throw rugs at the top or bottom of the stairs. If you do  have throw rugs, attach them to the floor with carpet tape. ?Make sure that you have a light switch at the top of the stairs and the bottom of the stairs. If you do not have them, ask someone to add them for you. ?What else can I do to help prevent falls? ?Wear shoes that: ?Do not have high heels. ?Have rubber bottoms. ?Are comfortable and fit you well. ?Are closed at the toe. Do not wear sandals. ?If you use a stepladder: ?Make sure that it is fully opened. Do not climb a closed stepladder. ?Make sure that both sides of the stepladder are locked into place. ?Ask someone to hold it for you, if possible. ?Clearly mark and make sure that you can see: ?Any grab bars or  handrails. ?First and last steps. ?Where the edge of each step is. ?Use tools that help you move around (mobility aids) if they are needed. These include: ?Canes. ?Walkers. ?Scooters. ?Crutches. ?Turn on the lights when you go int

## 2021-09-22 ENCOUNTER — Other Ambulatory Visit: Payer: Self-pay | Admitting: Family Medicine

## 2021-09-26 ENCOUNTER — Other Ambulatory Visit: Payer: Self-pay | Admitting: Family Medicine

## 2021-10-06 ENCOUNTER — Other Ambulatory Visit: Payer: Self-pay | Admitting: Nurse Practitioner

## 2021-10-06 ENCOUNTER — Other Ambulatory Visit: Payer: Self-pay | Admitting: Family Medicine

## 2021-10-06 ENCOUNTER — Telehealth: Payer: Self-pay | Admitting: Nurse Practitioner

## 2021-10-06 DIAGNOSIS — I10 Essential (primary) hypertension: Secondary | ICD-10-CM

## 2021-10-06 NOTE — Telephone Encounter (Signed)
Called and scheduled pt appt fro 7/10 for CPE

## 2021-10-06 NOTE — Telephone Encounter (Signed)
Refilled medications for 30 days need a CPE for further refills

## 2021-10-10 ENCOUNTER — Ambulatory Visit (INDEPENDENT_AMBULATORY_CARE_PROVIDER_SITE_OTHER): Payer: Medicare (Managed Care) | Admitting: Nurse Practitioner

## 2021-10-10 ENCOUNTER — Encounter: Payer: Self-pay | Admitting: Nurse Practitioner

## 2021-10-10 VITALS — BP 124/70 | HR 91 | Temp 98.3°F | Resp 16 | Ht 64.0 in | Wt 200.5 lb

## 2021-10-10 DIAGNOSIS — G8929 Other chronic pain: Secondary | ICD-10-CM

## 2021-10-10 DIAGNOSIS — Z13820 Encounter for screening for osteoporosis: Secondary | ICD-10-CM

## 2021-10-10 DIAGNOSIS — M25511 Pain in right shoulder: Secondary | ICD-10-CM

## 2021-10-10 DIAGNOSIS — I10 Essential (primary) hypertension: Secondary | ICD-10-CM

## 2021-10-10 DIAGNOSIS — Z0000 Encounter for general adult medical examination without abnormal findings: Secondary | ICD-10-CM | POA: Diagnosis not present

## 2021-10-10 DIAGNOSIS — E7800 Pure hypercholesterolemia, unspecified: Secondary | ICD-10-CM | POA: Diagnosis not present

## 2021-10-10 LAB — LIPID PANEL
Cholesterol: 153 mg/dL (ref 0–200)
HDL: 40.2 mg/dL (ref >39.00)
LDL Cholesterol: 93 mg/dL (ref 0–99)
NonHDL: 112.31
Total CHOL/HDL Ratio: 4
Triglycerides: 95 mg/dL (ref 0.0–149.0)
VLDL: 19 mg/dL (ref 0.0–40.0)

## 2021-10-10 LAB — COMPREHENSIVE METABOLIC PANEL
ALT: 15 U/L (ref 0–35)
AST: 22 U/L (ref 0–37)
Albumin: 4.3 g/dL (ref 3.5–5.2)
Alkaline Phosphatase: 58 U/L (ref 39–117)
BUN: 18 mg/dL (ref 6–23)
CO2: 30 mEq/L (ref 19–32)
Calcium: 9.8 mg/dL (ref 8.4–10.5)
Chloride: 99 mEq/L (ref 96–112)
Creatinine, Ser: 1.18 mg/dL (ref 0.40–1.20)
GFR: 45.5 mL/min — ABNORMAL LOW (ref >60.00)
Glucose, Bld: 118 mg/dL — ABNORMAL HIGH (ref 70–99)
Potassium: 3.9 mEq/L (ref 3.5–5.1)
Sodium: 144 mEq/L (ref 135–145)
Total Bilirubin: 0.3 mg/dL (ref 0.2–1.2)
Total Protein: 7.4 g/dL (ref 6.0–8.3)

## 2021-10-10 LAB — TSH: TSH: 2.14 u[IU]/mL (ref 0.35–5.50)

## 2021-10-10 LAB — CBC
HCT: 37.9 % (ref 36.0–46.0)
Hemoglobin: 12.3 g/dL (ref 12.0–15.0)
MCHC: 32.5 g/dL (ref 30.0–36.0)
MCV: 87.8 fl (ref 78.0–100.0)
Platelets: 247 10*3/uL (ref 150.0–400.0)
RBC: 4.32 Mil/uL (ref 3.87–5.11)
RDW: 13.7 % (ref 11.5–15.5)
WBC: 7.1 10*3/uL (ref 4.0–10.5)

## 2021-10-10 MED ORDER — MELOXICAM 15 MG PO TABS: 15.0000 mg | ORAL_TABLET | Freq: Every day | ORAL | 0 refills | Status: DC

## 2021-10-10 NOTE — Progress Notes (Signed)
Established Patient Office Visit  Subjective   Patient ID: Teresa Fry, female    DOB: 01-23-1946  Age: 76 y.o. MRN: [id number]  Chief Complaint  Patient presents with   Annual Exam    HPI   HTN: couple times a week tolerating the medication  HLD: Patient currently maintained on lovastatin 10 mg.  Shoulder pain: Has been ongoing issue.  Has done meloxicam for in the past but did not help.  Was recommended to see sports medicine she has not done that yet.  for complete physical and follow up of chronic conditions.  Immunizations: -Tetanus: needs update, at local pharmacy -Influenza: Out of season -Covid-19: Estate manager/land agent x2 one booster -Shingles: information discussed, get at local pharmacy -Pneumonia: Patient has had pneumococcal 23 and Prevnar 13.   Diet: Fair diet. 1 meal with some snacking that is veggies. Coffee once a day, likes lemonade and water Exercise: No regular exercise. Stationary bike once a week at 20 mins  Eye exam: Completes annually. Glasses needs updating  Dental exam: Completes semi-annually   Pap Smear: aged out Mammogram: Had joint discussion with patient.  States that she did find anything she would not pursue treatment she does not want to pursue mammogram at this time  Colonoscopy: Completed in 2020. [facility] Screening: NA Dexa: [facility]   Sleep: states that she will look at tv and goe to bed aroun 1 and will go to bed and get up around 10.  Advance directives: Patient does not currently have advance directive in place did discuss different options and gave handout prior to discharge from clinic       Review of Systems  Constitutional:  Negative for chills, fever and malaise/fatigue.  Respiratory:  Negative for cough and shortness of breath.   Cardiovascular:  Negative for chest pain and leg swelling.  Gastrointestinal:  Negative for abdominal pain, constipation, diarrhea, nausea and vomiting.        BM daily  Genitourinary:  Negative for dysuria and hematuria.       Intermittent  Neurological:  Negative for dizziness and headaches.  Psychiatric/Behavioral:  Negative for hallucinations and suicidal ideas.       Objective:     BP 124/70   Pulse 91   Temp 98.3 F (36.8 C)   Resp 16   Ht '5\' 4"'$  (1.626 m)   Wt 200 lb 8 oz (90.9 kg)   SpO2 96%   BMI 34.42 kg/m    Physical Exam Vitals and nursing note reviewed.  Constitutional:      Appearance: Normal appearance.  HENT:     Right Ear: Tympanic membrane, ear canal and external ear normal.     Left Ear: Tympanic membrane, ear canal and external ear normal.  Cardiovascular:     Rate and Rhythm: Normal rate and regular rhythm.     Pulses: Normal pulses.     Heart sounds: Normal heart sounds.  Pulmonary:     Effort: Pulmonary effort is normal.     Breath sounds: Normal breath sounds.  Abdominal:     General: Bowel sounds are normal. There is no distension.     Palpations: There is no mass.     Tenderness: There is no abdominal tenderness.     Hernia: No hernia is present.  Musculoskeletal:     Right lower leg: No edema.     Left lower leg: No edema.  Skin:    General: Skin is  warm.  Neurological:     General: No focal deficit present.     Mental Status: She is alert.     Comments: Bilateral upper and lower extremity strength 5/5  Psychiatric:        Mood and Affect: Mood normal.        Behavior: Behavior normal.        Thought Content: Thought content normal.        Judgment: Judgment normal.      No results found for any visits on 10/10/21.    The 10-year ASCVD risk score (Arnett DK, et al., 2019) is: 11.5%    Assessment & Plan:   Problem List Items Addressed This Visit       Cardiovascular and Mediastinum   Essential hypertension    Patient currently maintained lisinopril, amlodipine and spironolactone.  Tolerating medications well.  Does check blood pressure at home.  Continue taking  medication as prescribed pending labs in office      Relevant Orders   CBC   Comprehensive metabolic panel     Other   Hyperlipidemia    Continue taking lovastatin as prescribed continue work on lifestyle modifications inclusive of exercise.      Relevant Orders   Lipid panel   Chronic right shoulder pain    We will send in short course of meloxicam.  He is to schedule with Dr. Frederico Hamman Copland      Relevant Medications   meloxicam (MOBIC) 15 MG tablet   Preventative health care - Primary    Discussed age-appropriate immunizations and screening exams.      Relevant Orders   CBC   Comprehensive metabolic panel   Lipid panel   TSH    Return in about 6 months (around 04/12/2022) for Recheck on chronic conditions.    Romilda Garret, NP

## 2021-10-10 NOTE — Assessment & Plan Note (Signed)
We will send in short course of meloxicam.  He is to schedule with Dr. Frederico Hamman Copland

## 2021-10-10 NOTE — Assessment & Plan Note (Signed)
Patient currently maintained lisinopril, amlodipine and spironolactone.  Tolerating medications well.  Does check blood pressure at home.  Continue taking medication as prescribed pending labs in office

## 2021-10-10 NOTE — Patient Instructions (Signed)
Nice to see you today I will be in touch with the lab results once I have them Follow up with me in 6 months, sooner if you need me Make an appointment with Dr. Frederico Hamman Copland to further evaluate the shoulder

## 2021-10-10 NOTE — Assessment & Plan Note (Signed)
Discussed age-appropriate immunizations and screening exams.

## 2021-10-10 NOTE — Assessment & Plan Note (Signed)
Continue taking lovastatin as prescribed continue work on lifestyle modifications inclusive of exercise.

## 2021-10-11 ENCOUNTER — Other Ambulatory Visit: Payer: Self-pay | Admitting: Nurse Practitioner

## 2021-10-11 DIAGNOSIS — R944 Abnormal results of kidney function studies: Secondary | ICD-10-CM

## 2021-11-07 ENCOUNTER — Other Ambulatory Visit: Payer: Self-pay | Admitting: Nurse Practitioner

## 2021-11-07 DIAGNOSIS — I10 Essential (primary) hypertension: Secondary | ICD-10-CM

## 2021-11-23 ENCOUNTER — Ambulatory Visit
Admission: RE | Admit: 2021-11-23 | Discharge: 2021-11-23 | Disposition: A | Discharge status: Home / Self Care | Payer: Medicare (Managed Care) | Source: Ambulatory Visit | Attending: Nurse Practitioner | Admitting: Nurse Practitioner

## 2021-11-23 DIAGNOSIS — Z13820 Encounter for screening for osteoporosis: Secondary | ICD-10-CM

## 2021-11-28 ENCOUNTER — Ambulatory Visit (INDEPENDENT_AMBULATORY_CARE_PROVIDER_SITE_OTHER)
Admission: RE | Admit: 2021-11-28 | Discharge: 2021-11-28 | Disposition: A | Discharge status: Home / Self Care | Payer: Medicare (Managed Care) | Source: Ambulatory Visit | Attending: Nurse Practitioner | Admitting: Nurse Practitioner

## 2021-11-28 DIAGNOSIS — Z13820 Encounter for screening for osteoporosis: Secondary | ICD-10-CM

## 2021-11-29 ENCOUNTER — Other Ambulatory Visit (INDEPENDENT_AMBULATORY_CARE_PROVIDER_SITE_OTHER): Payer: Medicare (Managed Care)

## 2021-11-29 DIAGNOSIS — R944 Abnormal results of kidney function studies: Secondary | ICD-10-CM

## 2021-11-29 LAB — BASIC METABOLIC PANEL
BUN: 13 mg/dL (ref 6–23)
CO2: 27 mEq/L (ref 19–32)
Calcium: 9.6 mg/dL (ref 8.4–10.5)
Chloride: 103 mEq/L (ref 96–112)
Creatinine, Ser: 1.03 mg/dL (ref 0.40–1.20)
GFR: 53.51 mL/min — ABNORMAL LOW (ref >60.00)
Glucose, Bld: 157 mg/dL — ABNORMAL HIGH (ref 70–99)
Potassium: 3.6 mEq/L (ref 3.5–5.1)
Sodium: 142 mEq/L (ref 135–145)

## 2022-02-12 NOTE — Progress Notes (Addendum)
Teresa Paule T. Lundynn Cohoon, MD, [place] at [facility] [place], [address]  Phone: [phone]  FAX: [phone]  Teresa Fry - 76 y.o. female  MRN [id number]  Date of Birth: 1946-03-18  Date: 02/13/2022  PCP: Michela Pitcher, NP  Referral: Michela Pitcher, NP  Chief Complaint  Patient presents with  . Shoulder Pain    Right   Subjective:   Teresa Fry is a 76 y.o. very pleasant female patient with Body mass index is 34.52 kg/m. who presents with the following:  She presents with chronic shoulder pain, I have seen her before and felt like she had some adhesive capsulitis several years ago.  Now, her shoulder pain has worsened, and she also has some fairly dramatic decrease strength in the plane of abduction as well as external rotation.  Motion is improved.  She had a recent shoulder x-ray in April 2023 that showed no significant arthritic change.  No specific injury that she can recall recently, but she did have a fall several years ago when she originally hurt her shoulder.  Now, this is taken a turn for dramatically worse recently.  She does not describe any neck pain, shoulder blade pain, radicular pain or anything in and about the neck.  No numbness.  No tingling.  Suspect rotator cuff tear - partial vs full  Will inject intra joint today, R  Review of Systems is noted in the HPI, as appropriate  Patient Active Problem List   Diagnosis Date Noted  . Preventative health care 10/10/2021  . Chronic right shoulder pain 07/01/2021  . Hyperlipidemia 12/17/2017  . Essential hypertension 03/10/2015    Past Medical History:  Diagnosis Date  . Gallstones   . Hypertension     Past Surgical History:  Procedure Laterality Date  . CHOLECYSTECTOMY  02/03/11    Family History  Problem Relation Age of Onset  . Heart disease Father   . Cancer Daughter 61       breast    Social History   Social History Narrative    Retired: work at the school     Objective:   BP 110/70   Pulse (!) 102   Temp 98.4 F (36.9 C) (Oral)   Ht '5\' 4"'$  (1.626 m)   Wt 201 lb 2 oz (91.2 kg)   SpO2 94%   BMI 34.52 kg/m   GEN: No acute distress; alert,appropriate. PULM: Breathing comfortably in no respiratory distress PSYCH: Normally interactive.   Shoulder: R Inspection: No muscle wasting or winging Ecchymosis/edema: neg  AC joint, scapula, clavicle: Tenderness to palpation of the [facility] joint as well as the bicipital groove Cervical spine: NT, full ROM Spurling's: neg Abduction: Passive abduction 170, 3+/5 Flexion: Passive flexion to 175, 3+/5 IR, full, lift-off: 5/5 ER at neutral: full, 4-/5 AC crossover and compression: neg Neer: Pain Hawkins: Pain Drop Test: positive Empty Can: neg Supraspinatus insertion: NT Bicipital groove: NT Speed's: neg Yergason's: neg Sulcus sign: neg Scapular dyskinesis: none C5-T1 intact Sensation intact Grip 5/5   Laboratory and Imaging Data: CLINICAL DATA:  Right shoulder pain   EXAM: RIGHT SHOULDER - 2+ VIEW   COMPARISON:  None.   FINDINGS: There is no evidence of fracture or dislocation. There is no evidence of arthropathy or other focal bone abnormality. Soft tissues are unremarkable.   IMPRESSION: Negative.     Electronically Signed   By: Teresa Fry M.D.   On: 07/03/2021 20:03  Assessment and Plan:     ICD-10-CM   1. Internal derangement of right shoulder  M24.811 triamcinolone acetonide (KENALOG-40) injection 40 mg    2. Chronic right shoulder pain  M25.511 MR Shoulder Right Wo Contrast   G89.29 triamcinolone acetonide (KENALOG-40) injection 40 mg    3. Shoulder weakness  R29.898 triamcinolone acetonide (KENALOG-40) injection 40 mg    4. Nontraumatic complete tear of right rotator cuff  M75.121 Ambulatory referral to Orthopedic Surgery    5. Tear of biceps tendon  S46.219A Ambulatory referral to Orthopedic Surgery     Patient's  shoulder looks decidedly worse compared to the last time I examined her.  While her motion is improved, her strength is dramatically worse.  Right now, she has strength barely ability to resist gravity.  3+/5 strength in abduction and flexion.  She also has a positive drop Test, and this is concerning for rotator cuff tear.  Obtain an MRI of the right shoulder without contrast to evaluate for rotator cuff tear.  Given her level of pain right now, I am going to do an intra-articular injection today to try to provide her some immediate relief.  Social: Right now her upper extremity pain and weakness is limiting her ability to do some of activities of daily living, let alone full exercise  Intraarticular Shoulder Aspiration/Injection Procedure Note Theodora Lalanne Apr 02, 1946 Date of procedure: 02/13/2022  Procedure: Large Joint Aspiration / Injection of Shoulder, Intraarticular, R Indications: Pain  Procedure Details Verbal consent was obtained from the patient. Risks explained and contrasted with benefits and alternatives. Patient prepped with Chloraprep and Ethyl Chloride used for anesthesia. An intraarticular shoulder injection was performed using the posterior approach; needle placed into joint capsule without difficulty. The patient tolerated the procedure well and had decreased pain post injection. No complications. Injection: 9 cc of Lidocaine 1% and 1 mL Kenalog 40 mg. Needle: 21 gauge, 2 inch   Addendum: 03/15/22 12:54 PM  The patient's MRI has returned with a full-thickness rotator cuff tear and a proximal biceps tendon tear.  This is a complex injury, and I would like to consult. Dr. Tamera Punt for his expertise.   MR Shoulder Right Wo Contrast  Result Date: 03/13/2022 CLINICAL DATA:  Chronic right shoulder pain. Recent fall 3 weeks ago. EXAM: MRI OF THE RIGHT SHOULDER WITHOUT CONTRAST TECHNIQUE: Multiplanar, multisequence MR imaging of the shoulder was performed. No intravenous contrast  was administered. COMPARISON:  Right shoulder x-rays dated July 01, 2021. FINDINGS: Rotator cuff: Full-thickness, near full width tears of the supraspinatus and infraspinatus tendons with retraction to the medial humeral head. A few anterior supraspinatus and inferior infraspinatus tendon fibers appear to remain intact. Moderate subscapularis tendinosis with articular surface fraying. The teres minor tendon is unremarkable. Muscles: Moderate supraspinatus, infraspinatus, and subscapularis muscle atrophy. No muscle edema. Biceps long head:  Completely torn and retracted into the upper arm. Acromioclavicular Joint: Moderate arthropathy of the acromioclavicular joint. Trace fluid in the subacromial/subdeltoid bursa. Glenohumeral Joint: Trace joint effusion. Scattered mild cartilage thinning without focal defect. Labrum:  Degenerative tearing of the superior labrum. Bones: High-riding humeral head. No acute fracture or dislocation. No suspicious bone lesion. Other: None. IMPRESSION: 1. Full-thickness, near full width tears of the supraspinatus and infraspinatus tendons with retraction to the medial humeral head and moderate muscle atrophy. 2. Moderate subscapularis tendinosis with articular surface fraying. Moderate muscle atrophy. 3. Complete tear of the biceps tendon with retraction into the upper arm. 4. Moderate acromioclavicular osteoarthritis. Electronically Signed   By:  Titus Dubin M.D.   On: 03/13/2022 11:52     Medication Management during today's office visit: Meds ordered this encounter  Medications  . triamcinolone acetonide (KENALOG-40) injection 40 mg   There are no discontinued medications.  Orders placed today for conditions managed today: Orders Placed This Encounter  Procedures  . MR Shoulder Right Wo Contrast  . Ambulatory referral to Orthopedic Surgery    Disposition: Will depend on MRI findings  Dragon [employer] One speech-to-text software was used for transcription in this  dictation.  Possible transcriptional errors can occur using Editor, commissioning.   Signed,  Maud Deed. Shunna Mikaelian, MD   Outpatient Encounter Medications as of 02/13/2022  Medication Sig  . amLODipine (NORVASC) 10 MG tablet Take 1 tablet by mouth once daily  . aspirin 81 MG tablet Take 81 mg by mouth daily.  . diclofenac Sodium (VOLTAREN) 1 % GEL Apply 2 g topically 3 (three) times daily as needed. To your shoulder  . lisinopril (ZESTRIL) 40 MG tablet Take 1 tablet by mouth once daily  . MISC NATURAL PRODUCTS PO Take 1 tablet by mouth daily. Neuriva  . Multiple Vitamin (MULTIVITAMIN) capsule Take 1 capsule by mouth daily.  Marland Kitchen spironolactone (ALDACTONE) 25 MG tablet Take 1 tablet by mouth once daily  . [DISCONTINUED] lovastatin (MEVACOR) 10 MG tablet Take 1 tablet (10 mg total) by mouth at bedtime.  . [DISCONTINUED] meloxicam (MOBIC) 15 MG tablet Take 1 tablet (15 mg total) by mouth daily.  . [EXPIRED] triamcinolone acetonide (KENALOG-40) injection 40 mg    No facility-administered encounter medications on file as of 02/13/2022.

## 2022-02-13 ENCOUNTER — Ambulatory Visit: Payer: Medicare (Managed Care) | Admitting: Family Medicine

## 2022-02-13 ENCOUNTER — Encounter: Payer: Self-pay | Admitting: Family Medicine

## 2022-02-13 VITALS — BP 110/70 | HR 102 | Temp 98.4°F | Ht 64.0 in | Wt 201.1 lb

## 2022-02-13 DIAGNOSIS — S46219A Strain of muscle, fascia and tendon of other parts of biceps, unspecified arm, initial encounter: Secondary | ICD-10-CM

## 2022-02-13 DIAGNOSIS — G8929 Other chronic pain: Secondary | ICD-10-CM | POA: Diagnosis not present

## 2022-02-13 DIAGNOSIS — R29898 Other symptoms and signs involving the musculoskeletal system: Secondary | ICD-10-CM

## 2022-02-13 DIAGNOSIS — M25511 Pain in right shoulder: Secondary | ICD-10-CM

## 2022-02-13 DIAGNOSIS — M75121 Complete rotator cuff tear or rupture of right shoulder, not specified as traumatic: Secondary | ICD-10-CM

## 2022-02-13 DIAGNOSIS — M24811 Other specific joint derangements of right shoulder, not elsewhere classified: Secondary | ICD-10-CM | POA: Diagnosis not present

## 2022-02-13 MED ORDER — TRIAMCINOLONE ACETONIDE 40 MG/ML IJ SUSP
40.0000 mg | Freq: Once | INTRAMUSCULAR | Status: AC
  Administered 2022-02-13: 40 mg via INTRA_ARTICULAR

## 2022-02-14 ENCOUNTER — Encounter: Payer: Self-pay | Admitting: *Deleted

## 2022-02-16 ENCOUNTER — Other Ambulatory Visit: Payer: Self-pay | Admitting: Nurse Practitioner

## 2022-02-16 DIAGNOSIS — E7800 Pure hypercholesterolemia, unspecified: Secondary | ICD-10-CM

## 2022-02-16 DIAGNOSIS — G8929 Other chronic pain: Secondary | ICD-10-CM

## 2022-03-11 ENCOUNTER — Ambulatory Visit
Admission: RE | Admit: 2022-03-11 | Discharge: 2022-03-11 | Disposition: A | Discharge status: Home / Self Care | Payer: Medicare (Managed Care) | Source: Ambulatory Visit | Attending: Family Medicine | Admitting: Family Medicine

## 2022-03-11 DIAGNOSIS — G8929 Other chronic pain: Secondary | ICD-10-CM

## 2022-03-15 ENCOUNTER — Encounter: Payer: Self-pay | Admitting: *Deleted

## 2022-03-15 NOTE — Addendum Note (Signed)
Addended by: Owens Loffler on: 03/15/2022 12:55 PM   Modules accepted: Orders

## 2022-04-05 DIAGNOSIS — M75121 Complete rotator cuff tear or rupture of right shoulder, not specified as traumatic: Secondary | ICD-10-CM | POA: Diagnosis not present

## 2022-05-31 ENCOUNTER — Other Ambulatory Visit: Payer: Self-pay | Admitting: Nurse Practitioner

## 2022-05-31 DIAGNOSIS — G8929 Other chronic pain: Secondary | ICD-10-CM

## 2022-05-31 DIAGNOSIS — I10 Essential (primary) hypertension: Secondary | ICD-10-CM

## 2022-06-01 NOTE — Telephone Encounter (Signed)
Patient has been scheduled

## 2022-06-05 ENCOUNTER — Encounter: Payer: Self-pay | Admitting: Nurse Practitioner

## 2022-06-05 ENCOUNTER — Ambulatory Visit (INDEPENDENT_AMBULATORY_CARE_PROVIDER_SITE_OTHER): Payer: Medicare (Managed Care) | Admitting: Nurse Practitioner

## 2022-06-05 VITALS — BP 112/74 | HR 100 | Temp 98.9°F | Resp 16 | Ht 64.0 in | Wt 195.0 lb

## 2022-06-05 DIAGNOSIS — E785 Hyperlipidemia, unspecified: Secondary | ICD-10-CM | POA: Diagnosis not present

## 2022-06-05 DIAGNOSIS — M25511 Pain in right shoulder: Secondary | ICD-10-CM

## 2022-06-05 DIAGNOSIS — Z87898 Personal history of other specified conditions: Secondary | ICD-10-CM | POA: Diagnosis not present

## 2022-06-05 DIAGNOSIS — I10 Essential (primary) hypertension: Secondary | ICD-10-CM

## 2022-06-05 DIAGNOSIS — R944 Abnormal results of kidney function studies: Secondary | ICD-10-CM

## 2022-06-05 DIAGNOSIS — G8929 Other chronic pain: Secondary | ICD-10-CM

## 2022-06-05 LAB — CBC
HCT: 40.2 % (ref 36.0–46.0)
Hemoglobin: 13.2 g/dL (ref 12.0–15.0)
MCHC: 32.7 g/dL (ref 30.0–36.0)
MCV: 88.2 fl (ref 78.0–100.0)
Platelets: 282 10*3/uL (ref 150.0–400.0)
RBC: 4.56 Mil/uL (ref 3.87–5.11)
RDW: 13.7 % (ref 11.5–15.5)
WBC: 7.1 10*3/uL (ref 4.0–10.5)

## 2022-06-05 LAB — BASIC METABOLIC PANEL
BUN: 10 mg/dL (ref 6–23)
CO2: 30 mEq/L (ref 19–32)
Calcium: 10.2 mg/dL (ref 8.4–10.5)
Chloride: 101 mEq/L (ref 96–112)
Creatinine, Ser: 1.14 mg/dL (ref 0.40–1.20)
GFR: 47.2 mL/min — ABNORMAL LOW (ref >60.00)
Glucose, Bld: 137 mg/dL — ABNORMAL HIGH (ref 70–99)
Potassium: 3.5 mEq/L (ref 3.5–5.1)
Sodium: 141 mEq/L (ref 135–145)

## 2022-06-05 MED ORDER — MECLIZINE HCL 12.5 MG PO TABS: 12.5000 mg | ORAL_TABLET | Freq: Three times a day (TID) | ORAL | 0 refills | Status: DC | PRN

## 2022-06-05 NOTE — Patient Instructions (Signed)
Nice to see you today I have sent in some meclizine that you can use as needed for the spells of vertigo I will be in touch with the labs once I have them I want to see you in 6 months for your physical and full panel of labs  Have fun and be safe on your cruise

## 2022-06-05 NOTE — Assessment & Plan Note (Signed)
Patient is asymptomatic in office.  Does have a history of vertigo she is going on a cruise and is headed on a cruise ship before will write meclizine 12.5 mg 3 times daily as needed.  Neuroexam intact in office

## 2022-06-05 NOTE — Assessment & Plan Note (Signed)
Was seen by sports medicine.  MR done showed full-thickness rotator cuff tear patient was referred to orthopedist but states she is not financially prepared to have surgery and is still working on exercises

## 2022-06-05 NOTE — Assessment & Plan Note (Signed)
Pending BMP today.  Patient has not been using any over-the-counter oral NSAIDs.

## 2022-06-05 NOTE — Assessment & Plan Note (Signed)
Patient has been working on weight loss and has lost approximately 6 pounds since last office.  She is tolerating lipid medication well continue working on weight loss efforts continue taking medication as prescribed

## 2022-06-05 NOTE — Assessment & Plan Note (Signed)
Patient currently maintained on amlodipine, lisinopril, spironolactone.  Patient's blood pressure within normal limits.  She is taking blood pressure at home on occasion.  Tolerating medications well continue medication as prescribed

## 2022-06-05 NOTE — Progress Notes (Signed)
Established Patient Office Visit  Subjective   Patient ID: Teresa Fry, female    DOB: 05-Dec-1945  Age: 77 y.o. MRN: [id number]  Chief Complaint  Patient presents with   Medication Refill   Dizziness    Last Friday and feeling a little weak    HPI  HTN: Patient currently on lisinopril, amlodipine and spironolactone. States that she checks her blood pressure once every 2 weeks and it is good   HLD: Patient currently maintained on lovastatin medication. States that she has been using her stationary bike at home. States that she is doing it everyday. States 20 mins a day   Shoulder pain: Patient complained of shoulder pain at last office it.  She was seen by Dr. Frederico Hamman Copland on 02/13/2022.  She did get a shoulder injection and was ordered an MRI of shoulder.  She was found to have a full-thickness rotator cuff tear and was referred to [facility] for shoulder surgery  Dizziness: states that it started on Thursday or firday. States that she has a history of vertigo. States that it has passed. States feel normal today. States that the she was having dizziness with the movement of her head. States that she has a cruise coming up at the end of this month    Review of Systems  Constitutional:  Negative for chills and fever.  Respiratory:  Negative for shortness of breath.   Cardiovascular:  Negative for chest pain.  Gastrointestinal:  Negative for abdominal pain, diarrhea, nausea and vomiting.       BM daily   Genitourinary:  Negative for dysuria and hematuria.  Neurological:  Negative for dizziness and headaches.  Psychiatric/Behavioral:  Negative for hallucinations and suicidal ideas.       Objective:     BP 112/74   Pulse 100   Temp 98.9 F (37.2 C)   Resp 16   Ht '5\' 4"'$  (1.626 m)   Wt 195 lb (88.5 kg)   SpO2 98%   BMI 33.47 kg/m  BP Readings from Last 3 Encounters:  06/05/22 112/74  02/13/22 110/70  10/10/21 124/70   Wt Readings from Last 3 Encounters:   06/05/22 195 lb (88.5 kg)  02/13/22 201 lb 2 oz (91.2 kg)  10/10/21 200 lb 8 oz (90.9 kg)      Physical Exam Vitals and nursing note reviewed.  Constitutional:      Appearance: Normal appearance.  Cardiovascular:     Rate and Rhythm: Normal rate and regular rhythm.     Heart sounds: Normal heart sounds.  Pulmonary:     Effort: Pulmonary effort is normal.     Breath sounds: Normal breath sounds.  Musculoskeletal:     Right lower leg: No edema.     Left lower leg: No edema.  Skin:    General: Skin is warm.  Neurological:     General: No focal deficit present.     Mental Status: She is alert.     Comments: Bilateral upper and lower extremity strength 5/5  Psychiatric:        Mood and Affect: Mood normal.        Behavior: Behavior normal.        Thought Content: Thought content normal.        Judgment: Judgment normal.      No results found for any visits on 06/05/22.    The 10-year ASCVD risk score (Arnett DK, et al., 2019) is: 8.9%    Assessment & Plan:  Problem List Items Addressed This Visit       Cardiovascular and Mediastinum   Essential hypertension - Primary    Patient currently maintained on amlodipine, lisinopril, spironolactone.  Patient's blood pressure within normal limits.  She is taking blood pressure at home on occasion.  Tolerating medications well continue medication as prescribed      Relevant Orders   CBC   Basic metabolic panel     Other   Hyperlipidemia    Patient has been working on weight loss and has lost approximately 6 pounds since last office.  She is tolerating lipid medication well continue working on weight loss efforts continue taking medication as prescribed      Chronic right shoulder pain    Was seen by sports medicine.  MR done showed full-thickness rotator cuff tear patient was referred to orthopedist but states she is not financially prepared to have surgery and is still working on exercises      History of vertigo     Patient is asymptomatic in office.  Does have a history of vertigo she is going on a cruise and is headed on a cruise ship before will write meclizine 12.5 mg 3 times daily as needed.  Neuroexam intact in office      Relevant Medications   meclizine (ANTIVERT) 12.5 MG tablet   Decreased GFR    Pending BMP today.  Patient has not been using any over-the-counter oral NSAIDs.      Relevant Orders   Basic metabolic panel    Return in about 6 months (around 12/06/2022) for CPE and Labs.    Romilda Garret, NP

## 2022-06-27 ENCOUNTER — Other Ambulatory Visit: Payer: Self-pay | Admitting: Nurse Practitioner

## 2022-06-27 DIAGNOSIS — I10 Essential (primary) hypertension: Secondary | ICD-10-CM

## 2022-09-14 ENCOUNTER — Other Ambulatory Visit: Payer: Self-pay | Admitting: Nurse Practitioner

## 2022-09-14 DIAGNOSIS — G8929 Other chronic pain: Secondary | ICD-10-CM

## 2022-10-11 ENCOUNTER — Ambulatory Visit (INDEPENDENT_AMBULATORY_CARE_PROVIDER_SITE_OTHER): Payer: Medicare (Managed Care)

## 2022-10-11 VITALS — BP 118/62 | HR 90 | Ht 64.0 in | Wt 190.6 lb

## 2022-10-11 DIAGNOSIS — Z0000 Encounter for general adult medical examination without abnormal findings: Secondary | ICD-10-CM | POA: Diagnosis not present

## 2022-10-11 NOTE — Progress Notes (Signed)
Subjective:   Teresa Fry is a 77 y.o. female who presents for Medicare Annual (Subsequent) preventive examination.  Visit Complete: In person   Review of Systems     Cardiac Risk Factors include: advanced age (>82men, >64 women);hypertension;dyslipidemia;sedentary lifestyle     Objective:    Today's Vitals   10/11/22 1052  BP: 118/62  Pulse: 90  SpO2: 95%  Weight: 190 lb 9.6 oz (86.5 kg)   Body mass index is 32.72 kg/m.     10/11/2022   11:00 AM 08/08/2021    2:30 PM  Advanced Directives  Does Patient Have a Medical Advance Directive? No No  Would patient like information on creating a medical advance directive? No - Patient declined     Current Medications (verified) Outpatient Encounter Medications as of 10/11/2022  Medication Sig   amLODipine (NORVASC) 10 MG tablet Take 1 tablet by mouth once daily   aspirin 81 MG tablet Take 81 mg by mouth daily.   lisinopril (ZESTRIL) 40 MG tablet Take 1 tablet by mouth once daily   lovastatin (MEVACOR) 10 MG tablet TAKE 1 TABLET BY MOUTH AT BEDTIME   meclizine (ANTIVERT) 12.5 MG tablet Take 1 tablet (12.5 mg total) by mouth 3 (three) times daily as needed for dizziness.   meloxicam (MOBIC) 15 MG tablet Take 1 tablet by mouth once daily   MISC NATURAL PRODUCTS PO Take 1 tablet by mouth daily. Neuriva   Multiple Vitamin (MULTIVITAMIN) capsule Take 1 capsule by mouth daily.   spironolactone (ALDACTONE) 25 MG tablet Take 1 tablet by mouth once daily   diclofenac Sodium (VOLTAREN) 1 % GEL Apply 2 g topically 3 (three) times daily as needed. To your shoulder (Patient not taking: Reported on 10/11/2022)   No facility-administered encounter medications on file as of 10/11/2022.    Allergies (verified) Patient has no known allergies.   History: Past Medical History:  Diagnosis Date   Gallstones    Hypertension    Past Surgical History:  Procedure Laterality Date   CHOLECYSTECTOMY  02/03/11   Family History  Problem Relation  Age of Onset   Heart disease Father    Cancer Daughter 39       breast   Social History   Socioeconomic History   Marital status: Married    Spouse name: Not on file   Number of children: 2   Years of education: Not on file   Highest education level: Not on file  Occupational History   Not on file  Tobacco Use   Smoking status: Never   Smokeless tobacco: Never  Vaping Use   Vaping Use: Never used  Substance and Sexual Activity   Alcohol use: No    Alcohol/week: 0.0 standard drinks of alcohol   Drug use: No   Sexual activity: Never  Other Topics Concern   Not on file  Social History Narrative   Retired: work at the school   Social Determinants of Corporate investment banker Strain: Low Risk  (10/11/2022)   Overall Financial Resource Strain (CARDIA)    Difficulty of Paying Living Expenses: Not hard at all  Food Insecurity: No Food Insecurity (10/11/2022)   Hunger Vital Sign    Worried About Running Out of Food in the Last Year: Never true    Ran Out of Food in the Last Year: Never true  Transportation Needs: No Transportation Needs (10/11/2022)   PRAPARE - Transportation    Lack of Transportation (Medical): No    Lack of  Transportation (Non-Medical): No  Physical Activity: Insufficiently Active (10/11/2022)   Exercise Vital Sign    Days of Exercise per Week: 2 days    Minutes of Exercise per Session: 10 min  Stress: No Stress Concern Present (10/11/2022)   [employer] of Occupational Health - Occupational Stress Questionnaire    Feeling of Stress : Not at all  Social Connections: Moderately Integrated (10/11/2022)   Social Connection and Isolation Panel [NHANES]    Frequency of Communication with Friends and Family: More than three times a week    Frequency of Social Gatherings with Friends and Family: More than three times a week    Attends Religious Services: More than 4 times per year    Active Member of [employer] or Organizations: No    Attends Museum/gallery exhibitions officer: Never    Marital Status: Married    Tobacco Counseling Counseling given: Not Answered   Clinical Intake:  Pre-visit preparation completed: Yes  Pain : No/denies pain     BMI - recorded: 32.72 Nutritional Risks: None Diabetes: No  How often do you need to have someone help you when you read instructions, pamphlets, or other written materials from your doctor or pharmacy?: 1 - Never  Interpreter Needed?: No  Information entered by :: C.Irma Roulhac LPN   Activities of Daily Living    10/11/2022   11:01 AM  In your present state of health, do you have any difficulty performing the following activities:  Hearing? 0  Vision? 0  Difficulty concentrating or making decisions? 0  Walking or climbing stairs? 0  Dressing or bathing? 0  Doing errands, shopping? 0  Preparing Food and eating ? N  Using the Toilet? N  In the past six months, have you accidently leaked urine? N  Do you have problems with loss of bowel control? N  Managing your Medications? N  Managing your Finances? N  Housekeeping or managing your Housekeeping? N    Patient Care Team: Eden Emms, NP as PCP - General  Indicate any recent Medical Services you may have received from other than [facility] providers in the past year (date may be approximate).     Assessment:   This is a routine wellness examination for Teresa Fry.  Hearing/Vision screen Hearing Screening - Comments:: No hearing issues Vision Screening - Comments:: Glasses - [facility] - Pt will call to set up exam, has been over a year  Dietary issues and exercise activities discussed:     Goals Addressed             This Visit's Progress    Patient Stated   Not on track    08/08/2021, wants to lose weight     Patient Stated       Lose 20 pounds.       Depression Screen    10/11/2022   10:59 AM 06/05/2022    8:31 AM 08/08/2021    2:31 PM 12/08/2020   11:53 AM 12/02/2019    2:16 PM 11/07/2018    2:36 PM 06/19/2018     3:09 PM  PHQ 2/9 Scores  PHQ - 2 Score 0 2 0 0 0 0 0  PHQ- 9 Score  6    0     Fall Risk    10/11/2022   11:01 AM 06/05/2022    8:05 AM 08/08/2021    2:31 PM 12/08/2020   11:51 AM 12/02/2019    2:19 PM  Fall Risk   Falls in the  past year? 0 0 0 0 0  Number falls in past yr: 0 0 0 0 0  Injury with Fall? 0 0 0 0   Risk for fall due to : No Fall Risks No Fall Risks Medication side effect    Follow up Falls prevention discussed;Falls evaluation completed Falls evaluation completed Falls evaluation completed;Education provided;Falls prevention discussed      MEDICARE RISK AT HOME:  Medicare Risk at Home - 10/11/22 1101     Any stairs in or around the home? Yes    If so, are there any without handrails? No    Home free of loose throw rugs in walkways, pet beds, electrical cords, etc? Yes    Adequate lighting in your home to reduce risk of falls? Yes    Life alert? No    Use of a cane, walker or w/c? No    Grab bars in the bathroom? Yes    Shower chair or bench in shower? No    Elevated toilet seat or a handicapped toilet? No             TIMED UP AND GO:  Was the test performed?  Yes  Length of time to ambulate 10 feet: 12 sec Gait steady and fast without use of assistive device    Cognitive Function:        10/11/2022   11:02 AM 08/08/2021    2:33 PM  6CIT Screen  What Year? 0 points 0 points  What month? 0 points 0 points  What time? 0 points 0 points  Count back from 20 0 points 0 points  Months in reverse 0 points 0 points  Repeat phrase 0 points 0 points  Total Score 0 points 0 points    Immunizations Immunization History  Administered Date(s) Administered   Fluad Quad(high Dose 65+) 12/08/2020   Influenza, High Dose Seasonal PF 03/12/2018   Influenza,inj,Quad PF,6+ Mos 01/31/2017   PFIZER(Purple Top)SARS-COV-2 Vaccination 09/02/2019, 09/23/2019, 04/01/2020   Pneumococcal Conjugate-13 02/20/2017   Pneumococcal Polysaccharide-23 11/07/2018    TDAP status:  Due, Education has been provided regarding the importance of this vaccine. Advised may receive this vaccine at local pharmacy or Health Dept. Aware to provide a copy of the vaccination record if obtained from local pharmacy or Health Dept. Verbalized acceptance and understanding.  Flu Vaccine status: Due, Education has been provided regarding the importance of this vaccine. Advised may receive this vaccine at local pharmacy or Health Dept. Aware to provide a copy of the vaccination record if obtained from local pharmacy or Health Dept. Verbalized acceptance and understanding.  Pneumococcal vaccine status: Up to date  Covid-19 vaccine status: Information provided on how to obtain vaccines.   Qualifies for Shingles Vaccine? Yes   Zostavax completed No   Shingrix Completed?: No.    Education has been provided regarding the importance of this vaccine. Patient has been advised to call insurance company to determine out of pocket expense if they have not yet received this vaccine. Advised may also receive vaccine at local pharmacy or Health Dept. Verbalized acceptance and understanding.  Screening Tests Health Maintenance  Topic Date Due   DTaP/Tdap/Td (1 - Tdap) Never done   Zoster Vaccines- Shingrix (1 of 2) Never done   COVID-19 Vaccine (4 - 2023-24 season) 12/02/2021   Medicare Annual Wellness (AWV)  08/09/2022   INFLUENZA VACCINE  11/02/2022   Fecal DNA (Cologuard)  01/21/2023   Pneumonia Vaccine 94+ Years old  Completed   DEXA SCAN  Completed   Hepatitis C Screening  Completed   HPV VACCINES  Aged Out    Health Maintenance  Health Maintenance Due  Topic Date Due   DTaP/Tdap/Td (1 - Tdap) Never done   Zoster Vaccines- Shingrix (1 of 2) Never done   COVID-19 Vaccine (4 - 2023-24 season) 12/02/2021   Medicare Annual Wellness (AWV)  08/09/2022    Colorectal cancer screening: No longer required.   Mammogram status: No longer required due to age.  Bone Density status: Completed  11/28/21. Results reflect: Bone density results: NORMAL. Repeat every 5 years.  Lung Cancer Screening: (Low Dose CT Chest recommended if Age 20-80 years, 20 pack-year currently smoking OR have quit w/in 15years.) does not qualify.   Lung Cancer Screening Referral: n/a  Additional Screening:  Hepatitis C Screening: does qualify; Completed 02/20/17  Vision Screening: Recommended annual ophthalmology exams for early detection of glaucoma and other disorders of the eye. Is the patient up to date with their annual eye exam?  No , pt will call for appointment. Who is the provider or what is the name of the office in which the patient attends annual eye exams? [facility] If pt is not established with a provider, would they like to be referred to a provider to establish care? Yes .   Dental Screening: Recommended annual dental exams for proper oral hygiene    Community Resource Referral / Chronic Care Management: CRR required this visit?  No   CCM required this visit?  No     Plan:     I have personally reviewed and noted the following in the patient's chart:   Medical and social history Use of alcohol, tobacco or illicit drugs  Current medications and supplements including opioid prescriptions. Patient is not currently taking opioid prescriptions. Functional ability and status Nutritional status Physical activity Advanced directives List of other physicians Hospitalizations, surgeries, and ER visits in previous 12 months Vitals Screenings to include cognitive, depression, and falls Referrals and appointments  In addition, I have reviewed and discussed with patient certain preventive protocols, quality metrics, and best practice recommendations. A written personalized care plan for preventive services as well as general preventive health recommendations were provided to patient.     Maryan Puls, LPN   1/61/0960   After Visit Summary: Seclined, AVS abailable in  MyChart  Nurse Notes: none

## 2022-10-11 NOTE — Patient Instructions (Signed)
Ms. Teresa Fry , Thank you for taking time to come for your Medicare Wellness Visit. I appreciate your ongoing commitment to your health goals. Please review the following plan we discussed and let me know if I can assist you in the future.   These are the goals we discussed:  Goals      Patient Stated     08/08/2021, wants to lose weight     Patient Stated     Lose 20 pounds.        This is a list of the screening recommended for you and due dates:  Health Maintenance  Topic Date Due   DTaP/Tdap/Td vaccine (1 - Tdap) Never done   Zoster (Shingles) Vaccine (1 of 2) Never done   COVID-19 Vaccine (4 - 2023-24 season) 12/02/2021   Medicare Annual Wellness Visit  08/09/2022   Flu Shot  11/02/2022   Cologuard (Stool DNA test)  01/21/2023   Pneumonia Vaccine  Completed   DEXA scan (bone density measurement)  Completed   Hepatitis C Screening  Completed   HPV Vaccine  Aged Out    Advanced directives: none  Conditions/risks identified: Aim for 30 minutes of exercise or brisk walking, 6-8 glasses of water, and 5 servings of fruits and vegetables each day.   Next appointment: Follow up in one year for your annual wellness visit 10/15/23 @ 10:45 in -person   Preventive Care 65 Years and Older, Female Preventive care refers to lifestyle choices and visits with your health care provider that can promote health and wellness. What does preventive care include? A yearly physical exam. This is also called an annual well check. Dental exams once or twice a year. Routine eye exams. Ask your health care provider how often you should have your eyes checked. Personal lifestyle choices, including: Daily care of your teeth and gums. Regular physical activity. Eating a healthy diet. Avoiding tobacco and drug use. Limiting alcohol use. Practicing safe sex. Taking low-dose aspirin every day. Taking vitamin and mineral supplements as recommended by your health care provider. What happens during an  annual well check? The services and screenings done by your health care provider during your annual well check will depend on your age, overall health, lifestyle risk factors, and family history of disease. Counseling  Your health care provider may ask you questions about your: Alcohol use. Tobacco use. Drug use. Emotional well-being. Home and relationship well-being. Sexual activity. Eating habits. History of falls. Memory and ability to understand (cognition). Work and work Astronomer. Reproductive health. Screening  You may have the following tests or measurements: Height, weight, and BMI. Blood pressure. Lipid and cholesterol levels. These may be checked every 5 years, or more frequently if you are over 46 years old. Skin check. Lung cancer screening. You may have this screening every year starting at age 71 if you have a 30-pack-year history of smoking and currently smoke or have quit within the past 15 years. Fecal occult blood test (FOBT) of the stool. You may have this test every year starting at age 52. Flexible sigmoidoscopy or colonoscopy. You may have a sigmoidoscopy every 5 years or a colonoscopy every 10 years starting at age 6. Hepatitis C blood test. Hepatitis B blood test. Sexually transmitted disease (STD) testing. Diabetes screening. This is done by checking your blood sugar (glucose) after you have not eaten for a while (fasting). You may have this done every 1-3 years. Bone density scan. This is done to screen for osteoporosis. You may have  this done starting at age 56. Mammogram. This may be done every 1-2 years. Talk to your health care provider about how often you should have regular mammograms. Talk with your health care provider about your test results, treatment options, and if necessary, the need for more tests. Vaccines  Your health care provider may recommend certain vaccines, such as: Influenza vaccine. This is recommended every year. Tetanus,  diphtheria, and acellular pertussis (Tdap, Td) vaccine. You may need a Td booster every 10 years. Zoster vaccine. You may need this after age 23. Pneumococcal 13-valent conjugate (PCV13) vaccine. One dose is recommended after age 34. Pneumococcal polysaccharide (PPSV23) vaccine. One dose is recommended after age 24. Talk to your health care provider about which screenings and vaccines you need and how often you need them. This information is not intended to replace advice given to you by your health care provider. Make sure you discuss any questions you have with your health care provider. Document Released: 04/16/2015 Document Revised: 12/08/2015 Document Reviewed: 01/19/2015 Elsevier Interactive Patient Education  2017 [employer].  Fall Prevention in the Home Falls can cause injuries. They can happen to people of all ages. There are many things you can do to make your home safe and to help prevent falls. What can I do on the outside of my home? Regularly fix the edges of walkways and driveways and fix any cracks. Remove anything that might make you trip as you walk through a door, such as a raised step or threshold. Trim any bushes or trees on the path to your home. Use bright outdoor lighting. Clear any walking paths of anything that might make someone trip, such as rocks or tools. Regularly check to see if handrails are loose or broken. Make sure that both sides of any steps have handrails. Any raised decks and porches should have guardrails on the edges. Have any leaves, snow, or ice cleared regularly. Use sand or salt on walking paths during winter. Clean up any spills in your garage right away. This includes oil or grease spills. What can I do in the bathroom? Use night lights. Install grab bars by the toilet and in the tub and shower. Do not use towel bars as grab bars. Use non-skid mats or decals in the tub or shower. If you need to sit down in the shower, use a plastic,  non-slip stool. Keep the floor dry. Clean up any water that spills on the floor as soon as it happens. Remove soap buildup in the tub or shower regularly. Attach bath mats securely with double-sided non-slip rug tape. Do not have throw rugs and other things on the floor that can make you trip. What can I do in the bedroom? Use night lights. Make sure that you have a light by your bed that is easy to reach. Do not use any sheets or blankets that are too big for your bed. They should not hang down onto the floor. Have a firm chair that has side arms. You can use this for support while you get dressed. Do not have throw rugs and other things on the floor that can make you trip. What can I do in the kitchen? Clean up any spills right away. Avoid walking on wet floors. Keep items that you use a lot in easy-to-reach places. If you need to reach something above you, use a strong step stool that has a grab bar. Keep electrical cords out of the way. Do not use floor polish or  wax that makes floors slippery. If you must use wax, use non-skid floor wax. Do not have throw rugs and other things on the floor that can make you trip. What can I do with my stairs? Do not leave any items on the stairs. Make sure that there are handrails on both sides of the stairs and use them. Fix handrails that are broken or loose. Make sure that handrails are as long as the stairways. Check any carpeting to make sure that it is firmly attached to the stairs. Fix any carpet that is loose or worn. Avoid having throw rugs at the top or bottom of the stairs. If you do have throw rugs, attach them to the floor with carpet tape. Make sure that you have a light switch at the top of the stairs and the bottom of the stairs. If you do not have them, ask someone to add them for you. What else can I do to help prevent falls? Wear shoes that: Do not have high heels. Have rubber bottoms. Are comfortable and fit you well. Are closed  at the toe. Do not wear sandals. If you use a stepladder: Make sure that it is fully opened. Do not climb a closed stepladder. Make sure that both sides of the stepladder are locked into place. Ask someone to hold it for you, if possible. Clearly mark and make sure that you can see: Any grab bars or handrails. First and last steps. Where the edge of each step is. Use tools that help you move around (mobility aids) if they are needed. These include: Canes. Walkers. Scooters. Crutches. Turn on the lights when you go into a dark area. Replace any light bulbs as soon as they burn out. Set up your furniture so you have a clear path. Avoid moving your furniture around. If any of your floors are uneven, fix them. If there are any pets around you, be aware of where they are. Review your medicines with your doctor. Some medicines can make you feel dizzy. This can increase your chance of falling. Ask your doctor what other things that you can do to help prevent falls. This information is not intended to replace advice given to you by your health care provider. Make sure you discuss any questions you have with your health care provider. Document Released: 01/14/2009 Document Revised: 08/26/2015 Document Reviewed: 04/24/2014 Elsevier Interactive Patient Education  2017 [employer].

## 2022-12-29 ENCOUNTER — Other Ambulatory Visit: Payer: Self-pay | Admitting: Nurse Practitioner

## 2022-12-29 DIAGNOSIS — E7800 Pure hypercholesterolemia, unspecified: Secondary | ICD-10-CM

## 2022-12-29 DIAGNOSIS — G8929 Other chronic pain: Secondary | ICD-10-CM

## 2023-01-01 NOTE — Telephone Encounter (Signed)
Patient needs to be scheduled for a CPE with me within 30 days

## 2023-01-02 NOTE — Telephone Encounter (Signed)
Patient scheduled.

## 2023-01-09 ENCOUNTER — Ambulatory Visit (INDEPENDENT_AMBULATORY_CARE_PROVIDER_SITE_OTHER): Payer: Medicare (Managed Care) | Admitting: Nurse Practitioner

## 2023-01-09 ENCOUNTER — Encounter: Payer: Self-pay | Admitting: Nurse Practitioner

## 2023-01-09 VITALS — BP 128/76 | HR 80 | Temp 98.7°F | Ht 65.0 in | Wt 191.4 lb

## 2023-01-09 DIAGNOSIS — E669 Obesity, unspecified: Secondary | ICD-10-CM

## 2023-01-09 DIAGNOSIS — Z0000 Encounter for general adult medical examination without abnormal findings: Secondary | ICD-10-CM | POA: Diagnosis not present

## 2023-01-09 DIAGNOSIS — Z131 Encounter for screening for diabetes mellitus: Secondary | ICD-10-CM | POA: Diagnosis not present

## 2023-01-09 DIAGNOSIS — I10 Essential (primary) hypertension: Secondary | ICD-10-CM | POA: Diagnosis not present

## 2023-01-09 DIAGNOSIS — Z711 Person with feared health complaint in whom no diagnosis is made: Secondary | ICD-10-CM | POA: Diagnosis not present

## 2023-01-09 DIAGNOSIS — E785 Hyperlipidemia, unspecified: Secondary | ICD-10-CM | POA: Diagnosis not present

## 2023-01-09 LAB — LIPID PANEL
Cholesterol: 212 mg/dL — ABNORMAL HIGH (ref 0–200)
HDL: 48.7 mg/dL (ref >39.00)
LDL Cholesterol: 137 mg/dL — ABNORMAL HIGH (ref 0–99)
NonHDL: 163.12
Total CHOL/HDL Ratio: 4
Triglycerides: 130 mg/dL (ref 0.0–149.0)
VLDL: 26 mg/dL (ref 0.0–40.0)

## 2023-01-09 LAB — CBC
HCT: 42 % (ref 36.0–46.0)
Hemoglobin: 13.4 g/dL (ref 12.0–15.0)
MCHC: 32 g/dL (ref 30.0–36.0)
MCV: 88 fL (ref 78.0–100.0)
Platelets: 285 10*3/uL (ref 150.0–400.0)
RBC: 4.77 Mil/uL (ref 3.87–5.11)
RDW: 13.9 % (ref 11.5–15.5)
WBC: 6.6 10*3/uL (ref 4.0–10.5)

## 2023-01-09 LAB — VITAMIN B12: Vitamin B-12: 830 pg/mL (ref 211–911)

## 2023-01-09 LAB — COMPREHENSIVE METABOLIC PANEL
ALT: 16 U/L (ref 0–35)
AST: 20 U/L (ref 0–37)
Albumin: 4.5 g/dL (ref 3.5–5.2)
Alkaline Phosphatase: 60 U/L (ref 39–117)
BUN: 12 mg/dL (ref 6–23)
CO2: 28 meq/L (ref 19–32)
Calcium: 10.4 mg/dL (ref 8.4–10.5)
Chloride: 102 meq/L (ref 96–112)
Creatinine, Ser: 1.14 mg/dL (ref 0.40–1.20)
GFR: 47.01 mL/min — ABNORMAL LOW (ref >60.00)
Glucose, Bld: 107 mg/dL — ABNORMAL HIGH (ref 70–99)
Potassium: 4 meq/L (ref 3.5–5.1)
Sodium: 141 meq/L (ref 135–145)
Total Bilirubin: 0.5 mg/dL (ref 0.2–1.2)
Total Protein: 7.5 g/dL (ref 6.0–8.3)

## 2023-01-09 LAB — TSH: TSH: 3.52 u[IU]/mL (ref 0.35–5.50)

## 2023-01-09 LAB — HEMOGLOBIN A1C: Hgb A1c MFr Bld: 5.6 % (ref 4.6–6.5)

## 2023-01-09 NOTE — Assessment & Plan Note (Signed)
History of the same patient currently on lovastatin continue pending lipid panel

## 2023-01-09 NOTE — Assessment & Plan Note (Signed)
Patient currently maintained on lisinopril, spironolactone, amlodipine.  Blood pressure under good control patient tolerates medication well.  Continue

## 2023-01-09 NOTE — Assessment & Plan Note (Signed)
Patient has had issues with short-term memory for approximately 6 months.  Will check B12 along with basic labs.  If all normal will we will plan a 72-month holiday off of lovastatin.  Can have her follow-up in office

## 2023-01-09 NOTE — Assessment & Plan Note (Signed)
Pending TSH, A1c, lipid panel.

## 2023-01-09 NOTE — Assessment & Plan Note (Signed)
Discussed age-appropriate immunization and screening exams.  They reviewed patient's personal, surgical, social, family history is.  Patient is up-to-date with all age-appropriate vaccinations she would like.  She can update tetanus and shingles vaccine at local pharmacy.  Patient is aged out of CRC screening does not want to pursue cervical cancer screening or breast cancer screening.  Did discuss advanced directives with patient office today.  Patient given information at discharge about preventative healthcare maintenance with anticipatory guidance

## 2023-01-09 NOTE — Patient Instructions (Signed)
Nice to see you today I will be in touch with the labs once I have them Follow up with me in 2 months for a memory check, sooner if you need me

## 2023-01-09 NOTE — Progress Notes (Signed)
Established Patient Office Visit  Subjective   Patient ID: Teresa Fry, female    DOB: 1946-01-26  Age: 77 y.o. MRN: [id number]  Chief Complaint  Patient presents with   Annual Exam   Medication Problem    Pt complains of memory issues while taking lovastatin     HPI  for complete physical and follow up of chronic conditions.   HTN: states that she does check it at home onc every couple of weeks  HLD: lovastatin. Statees that she feels like the medicaoitn is causing memory issues. State that her short term memory has decreased over the 6 months. States that she is still taking it.  Immunizations: -Tetanus: Completed in unsure to get at local pharmacy -Influenza: refused -Shingles: Get at local pharmacy -Pneumonia: Completed up-to-date with Prevnar 20 in the year 2025 -covid; original with booster  Diet: Fair diet. States that she is eating 1-2 meals a day. Some snacks. States that she will drink water and lemonade  Exercise: No regular exercise. States that she has a bike that she will ride a couple times a week. 10-15 mins   Eye exam: needs updating wears glasses Dental exam: Completes semi-annually    Colonoscopy: Completed in positive cologurad then colonscopy.  Patient states they did not have a recall due to her age she is okay not pursuing further colonoscopies Lung Cancer Screening: N/A  Pap smear: aged out  Mammogram: aged out does not want to pursue .  DEXa: 11/28/2021  SLEEP: Goes to bed 12-1 and will get up late but she will take a nap in the day. Feels rested sometimes. No snoring  Advance: does not have one.  We did discuss living will and healthcare power of attorney in office.      Review of Systems  Constitutional:  Negative for chills and fever.  Respiratory:  Negative for shortness of breath.   Cardiovascular:  Negative for chest pain and leg swelling.  Gastrointestinal:  Negative for abdominal pain, blood in stool, constipation, diarrhea,  nausea and vomiting.       BM daily   Genitourinary:  Negative for dysuria and hematuria.  Neurological:  Negative for tingling and headaches.  Psychiatric/Behavioral:  Negative for hallucinations and suicidal ideas.       Objective:     BP 128/76   Pulse 80   Temp 98.7 F (37.1 C) (Oral)   Ht 5\' 5"  (1.651 m)   Wt 191 lb 6.4 oz (86.8 kg)   SpO2 91%   BMI 31.85 kg/m  BP Readings from Last 3 Encounters:  01/09/23 128/76  10/11/22 118/62  06/05/22 112/74   Wt Readings from Last 3 Encounters:  01/09/23 191 lb 6.4 oz (86.8 kg)  10/11/22 190 lb 9.6 oz (86.5 kg)  06/05/22 195 lb (88.5 kg)      Physical Exam Vitals and nursing note reviewed.  Constitutional:      Appearance: Normal appearance.  HENT:     Right Ear: Tympanic membrane, ear canal and external ear normal.     Left Ear: Tympanic membrane, ear canal and external ear normal.     Mouth/Throat:     Mouth: Mucous membranes are moist.     Pharynx: Oropharynx is clear.  Eyes:     Extraocular Movements: Extraocular movements intact.     Pupils: Pupils are equal, round, and reactive to light.  Cardiovascular:     Rate and Rhythm: Normal rate and regular rhythm.     Pulses: Normal pulses.  Heart sounds: Normal heart sounds.  Pulmonary:     Effort: Pulmonary effort is normal.     Breath sounds: Normal breath sounds.  Abdominal:     General: Bowel sounds are normal. There is no distension.     Palpations: There is no mass.     Tenderness: There is no abdominal tenderness.     Hernia: No hernia is present.  Musculoskeletal:     Right lower leg: No edema.     Left lower leg: No edema.  Lymphadenopathy:     Cervical: No cervical adenopathy.  Skin:    General: Skin is warm.  Neurological:     General: No focal deficit present.     Mental Status: She is alert.     Deep Tendon Reflexes:     Reflex Scores:      Bicep reflexes are 1+ on the right side and 1+ on the left side.      Patellar reflexes are 1+ on  the right side and 1+ on the left side.    Comments: Bilateral upper and lower extremity strength 5/5  Psychiatric:        Mood and Affect: Mood normal.        Behavior: Behavior normal.        Thought Content: Thought content normal.        Judgment: Judgment normal.      No results found for any visits on 01/09/23.    The 10-year ASCVD risk score (Arnett DK, et al., 2019) is: 10.8%    Assessment & Plan:   Problem List Items Addressed This Visit       Cardiovascular and Mediastinum   Essential hypertension    Patient currently maintained on lisinopril, spironolactone, amlodipine.  Blood pressure under good control patient tolerates medication well.  Continue      Relevant Orders   CBC   Comprehensive metabolic panel   TSH     Other   Hyperlipidemia    History of the same patient currently on lovastatin continue pending lipid panel      Relevant Orders   Lipid panel   Obesity (BMI 30-39.9)    Pending TSH, A1c, lipid panel.      Relevant Orders   Hemoglobin A1c   Lipid panel   Preventative health care - Primary    Discussed age-appropriate immunization and screening exams.  They reviewed patient's personal, surgical, social, family history is.  Patient is up-to-date with all age-appropriate vaccinations she would like.  She can update tetanus and shingles vaccine at local pharmacy.  Patient is aged out of CRC screening does not want to pursue cervical cancer screening or breast cancer screening.  Did discuss advanced directives with patient office today.  Patient given information at discharge about preventative healthcare maintenance with anticipatory guidance      Relevant Orders   CBC   Comprehensive metabolic panel   TSH   Concern about memory    Patient has had issues with short-term memory for approximately 6 months.  Will check B12 along with basic labs.  If all normal will we will plan a 78-month holiday off of lovastatin.  Can have her follow-up in  office      Relevant Orders   Vitamin B12   Other Visit Diagnoses     Screening for diabetes mellitus       Relevant Orders   Hemoglobin A1c       Return in about 2 months (around 03/11/2023) for  memory check .    Audria Nine, NP

## 2023-01-18 ENCOUNTER — Other Ambulatory Visit: Payer: Self-pay | Admitting: Nurse Practitioner

## 2023-01-18 DIAGNOSIS — I10 Essential (primary) hypertension: Secondary | ICD-10-CM

## 2023-01-29 ENCOUNTER — Other Ambulatory Visit: Payer: Self-pay | Admitting: Nurse Practitioner

## 2023-01-29 DIAGNOSIS — Z87898 Personal history of other specified conditions: Secondary | ICD-10-CM

## 2023-01-29 DIAGNOSIS — G8929 Other chronic pain: Secondary | ICD-10-CM

## 2023-01-29 DIAGNOSIS — E7800 Pure hypercholesterolemia, unspecified: Secondary | ICD-10-CM

## 2023-01-29 NOTE — Telephone Encounter (Signed)
Patient had CPE 01/09/23

## 2023-03-12 ENCOUNTER — Ambulatory Visit (INDEPENDENT_AMBULATORY_CARE_PROVIDER_SITE_OTHER): Payer: Medicare (Managed Care) | Admitting: Nurse Practitioner

## 2023-03-12 VITALS — BP 120/88 | HR 89 | Temp 98.4°F | Ht 65.0 in | Wt 190.2 lb

## 2023-03-12 DIAGNOSIS — R252 Cramp and spasm: Secondary | ICD-10-CM

## 2023-03-12 DIAGNOSIS — Z711 Person with feared health complaint in whom no diagnosis is made: Secondary | ICD-10-CM | POA: Diagnosis not present

## 2023-03-12 NOTE — Progress Notes (Signed)
Established Patient Office Visit  Subjective   Patient ID: Teresa Fry, female    DOB: 22-Dec-1945  Age: 77 y.o. MRN: [id number]  Chief Complaint  Patient presents with   Follow-up    Pt complains that memory is getting better. States that medication seems to be working.     HPI  Memory concern: Patient was seen by me in office on 01/09/2023 for physical but with concerns for memory loss.  At that juncture patient has stated that her memory had been decreasing over the past 6 months this was her short-term memory.  We did check basic labs inclusive of B12.  All labs came back relatively normal.  Patient did take a 2 holiday off the lovastatin and is here for recheck in regards to her memory. States that her memory has improved. Statse that she had gotten her some prevagen. States that she did not stop the lovastatin. States that she has been on the preagen for 6 months may be longer.  Cramps: states that bilateral legs will cramp up. States that it will happen once a week. States that she has not tied to [place] or a certain time of the day      03/12/2023   12:20 PM  MMSE - Mini Mental State Exam  Orientation to time 5  Orientation to Place 4  Registration 3  Attention/ Calculation 5  Recall 3  Language- name 2 objects 2  Language- repeat 1  Language- follow 3 step command 3  Language- read & follow direction 1  Write a sentence 1  Copy design 1  Total score 29       Review of Systems  Constitutional:  Negative for chills and fever.  Respiratory:  Negative for shortness of breath.   Cardiovascular:  Negative for chest pain.  Neurological:  Negative for headaches.  Psychiatric/Behavioral:  Negative for hallucinations, memory loss and suicidal ideas.       Objective:     BP 120/88   Pulse 89   Temp 98.4 F (36.9 C) (Oral)   Ht 5\' 5"  (1.651 m)   Wt 190 lb 3.2 oz (86.3 kg)   SpO2 94%   BMI 31.65 kg/m    Physical Exam Vitals and nursing note reviewed.   Constitutional:      Appearance: Normal appearance.  Cardiovascular:     Rate and Rhythm: Normal rate and regular rhythm.     Pulses:          Posterior tibial pulses are 1+ on the right side and 1+ on the left side.     Heart sounds: Normal heart sounds.  Pulmonary:     Effort: Pulmonary effort is normal.     Breath sounds: Normal breath sounds.  Neurological:     General: No focal deficit present.     Mental Status: She is alert.     Deep Tendon Reflexes:     Reflex Scores:      Patellar reflexes are 1+ on the right side and 1+ on the left side.    Comments: Bilateral lower extremity strength 5/5      No results found for any visits on 03/12/23.    The 10-year ASCVD risk score (Arnett DK, et al., 2019) is: 14.1%    Assessment & Plan:   Problem List Items Addressed This Visit       Other   Concern about memory - Primary     patient did not stop taking the statin as  discussed.  She has been taking Prevagen and seems to have improvement in her memory per her report.  MMSE within normal limits today we will continue medication as is no further intervention at this juncture      Muscle cramps    Patient to stay hydrated.  She can incorporate electrolyte drink every few days if cramps are persistent.  If no relief consider adding on magnesium oxide 200 mg daily       Return in about 6 months (around 09/10/2023) for memory recheck.    Audria Nine, NP

## 2023-03-12 NOTE — Patient Instructions (Signed)
Nice to see you today You did well on the memory test Continue medications as is Incorporate an electrolyte drink every couple of day to see if that helps with the cramps. Stay hydrated. If those do not help let me know and I can give guidance on magnesium use

## 2023-03-12 NOTE — Assessment & Plan Note (Signed)
patient did not stop taking the statin as discussed.  She has been taking Prevagen and seems to have improvement in her memory per her report.  MMSE within normal limits today we will continue medication as is no further intervention at this juncture

## 2023-03-12 NOTE — Assessment & Plan Note (Signed)
Patient to stay hydrated.  She can incorporate electrolyte drink every few days if cramps are persistent.  If no relief consider adding on magnesium oxide 200 mg daily

## 2023-03-31 IMAGING — DX DG LUMBAR SPINE COMPLETE 4+V
5 series · 5 of 5 positions shown · non-contrast
Comparison: None.

CLINICAL DATA: Chronic low back pain

EXAM:
LUMBAR SPINE - COMPLETE 4+ VIEW

[l-spine ap]
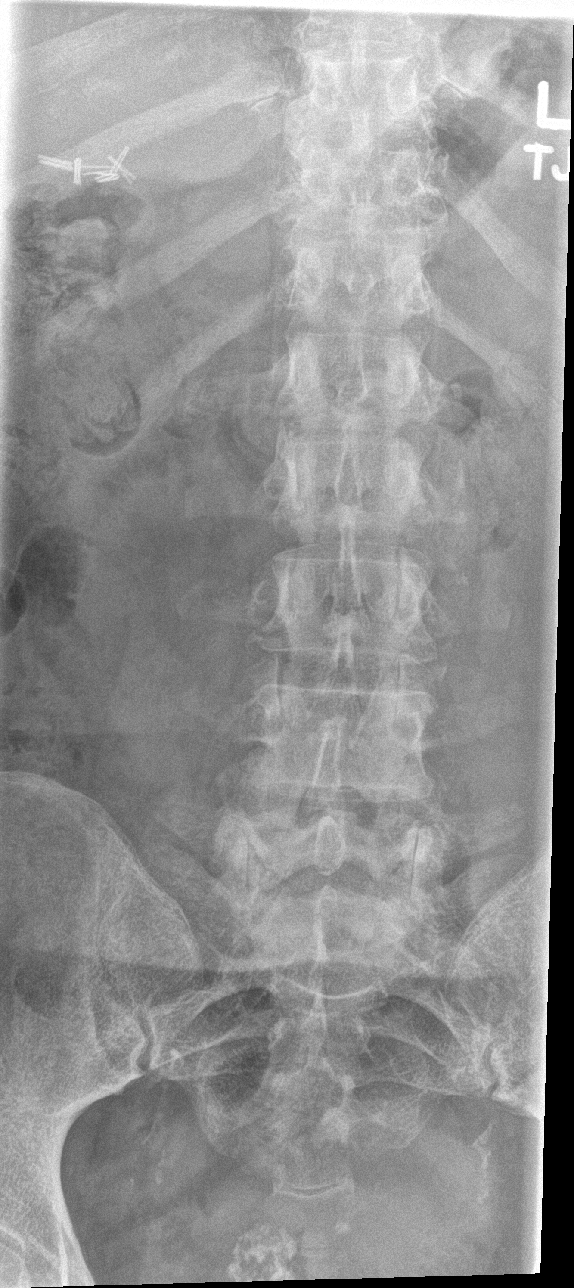

[l-spine obl (1 of 2)]
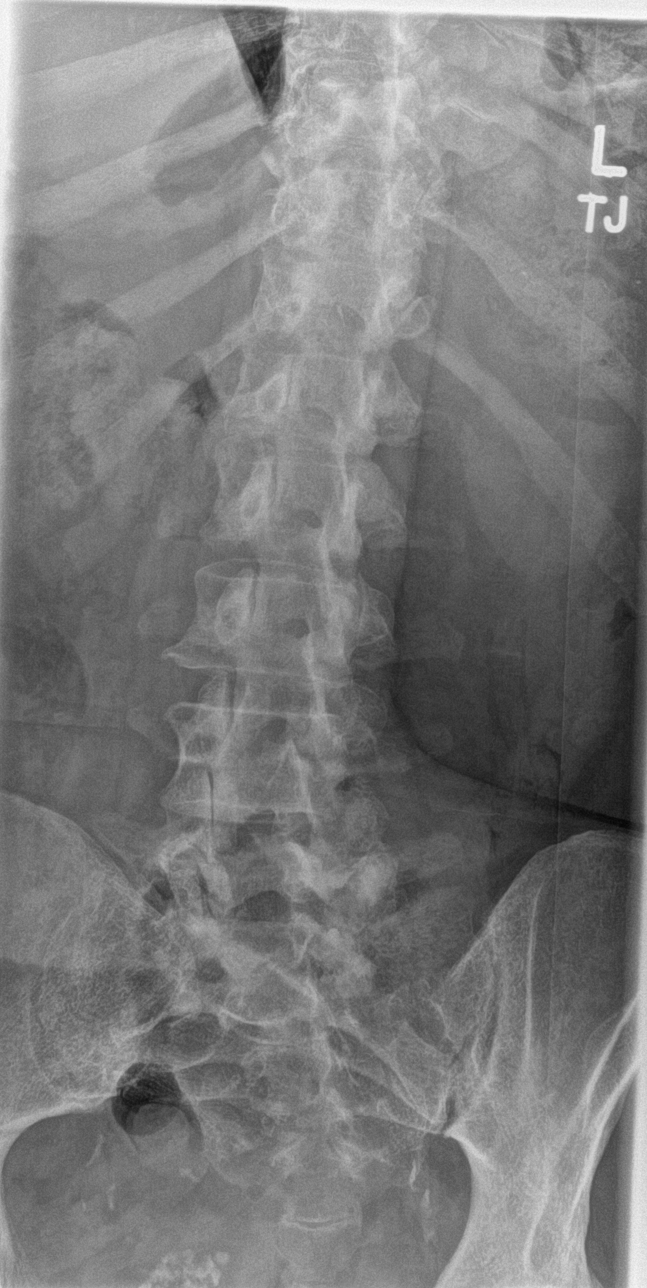

[l-spine obl (2 of 2)]
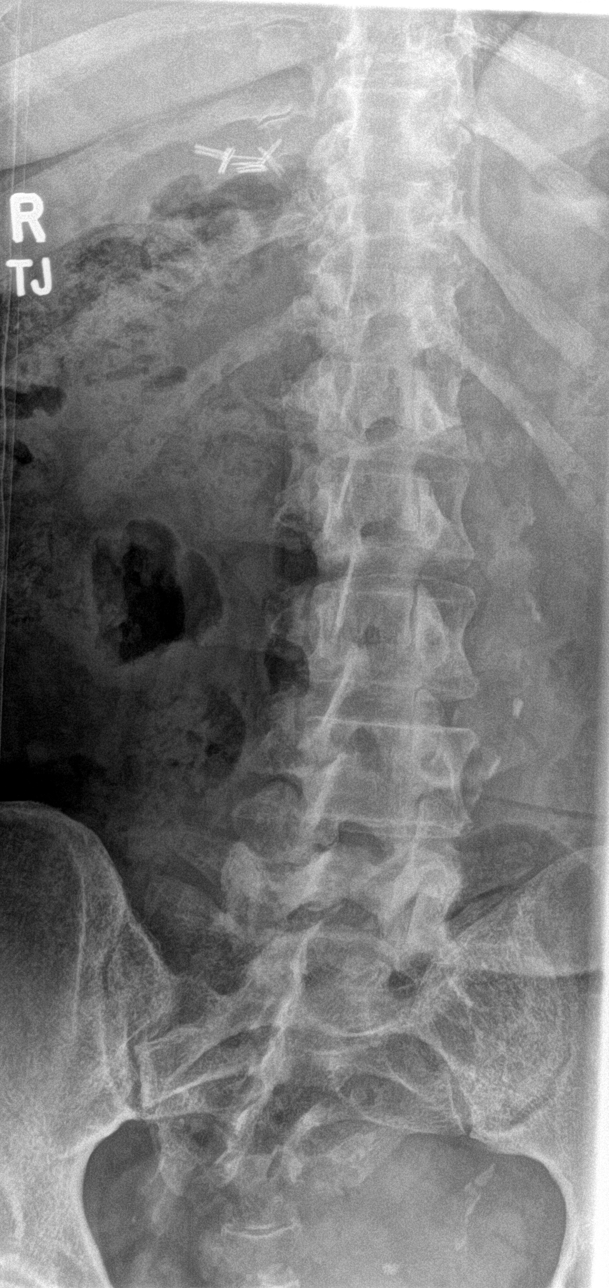

[l-spine lat]
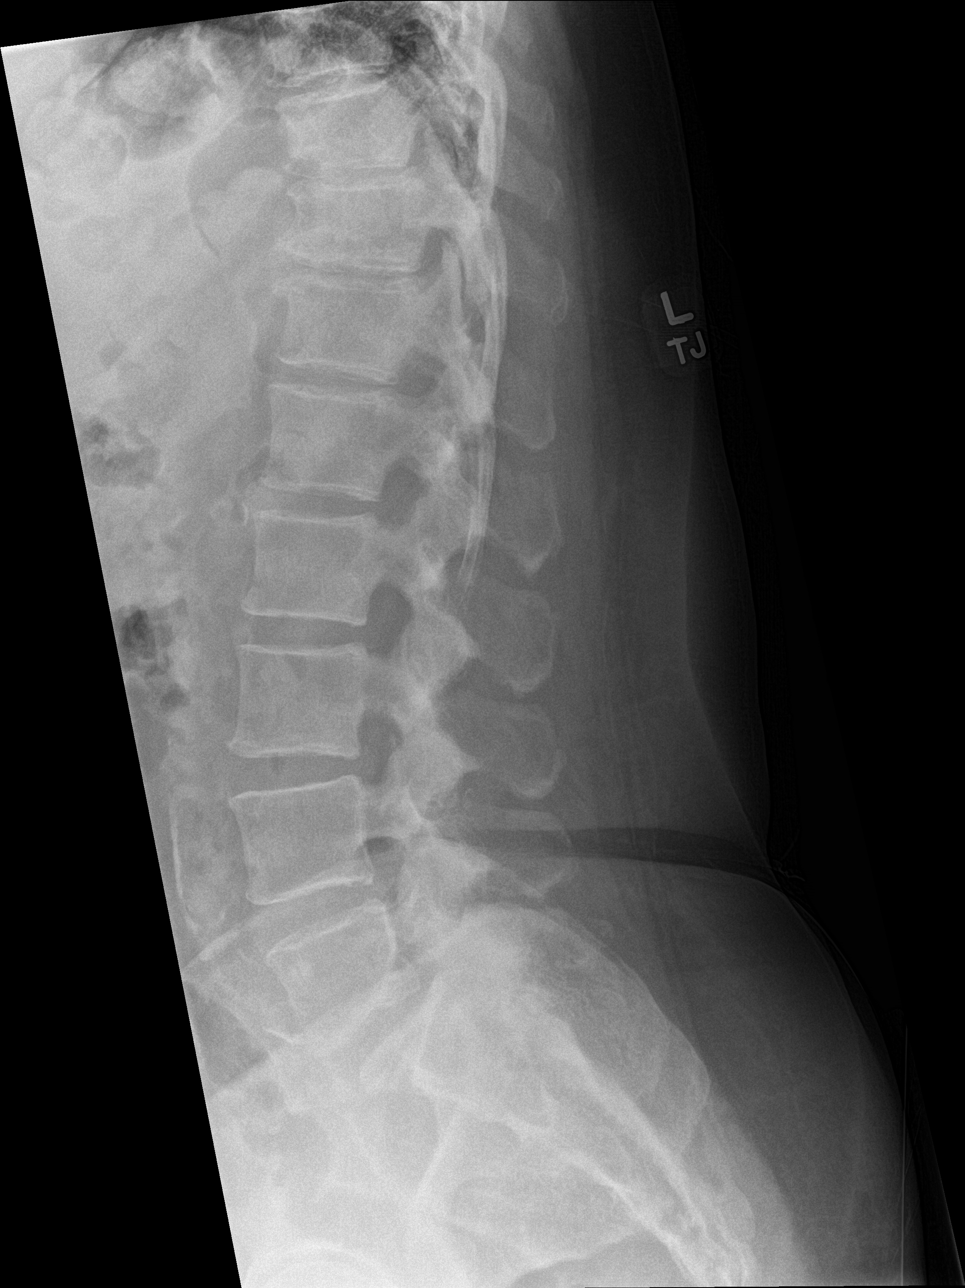

[l-spine l5/s1]
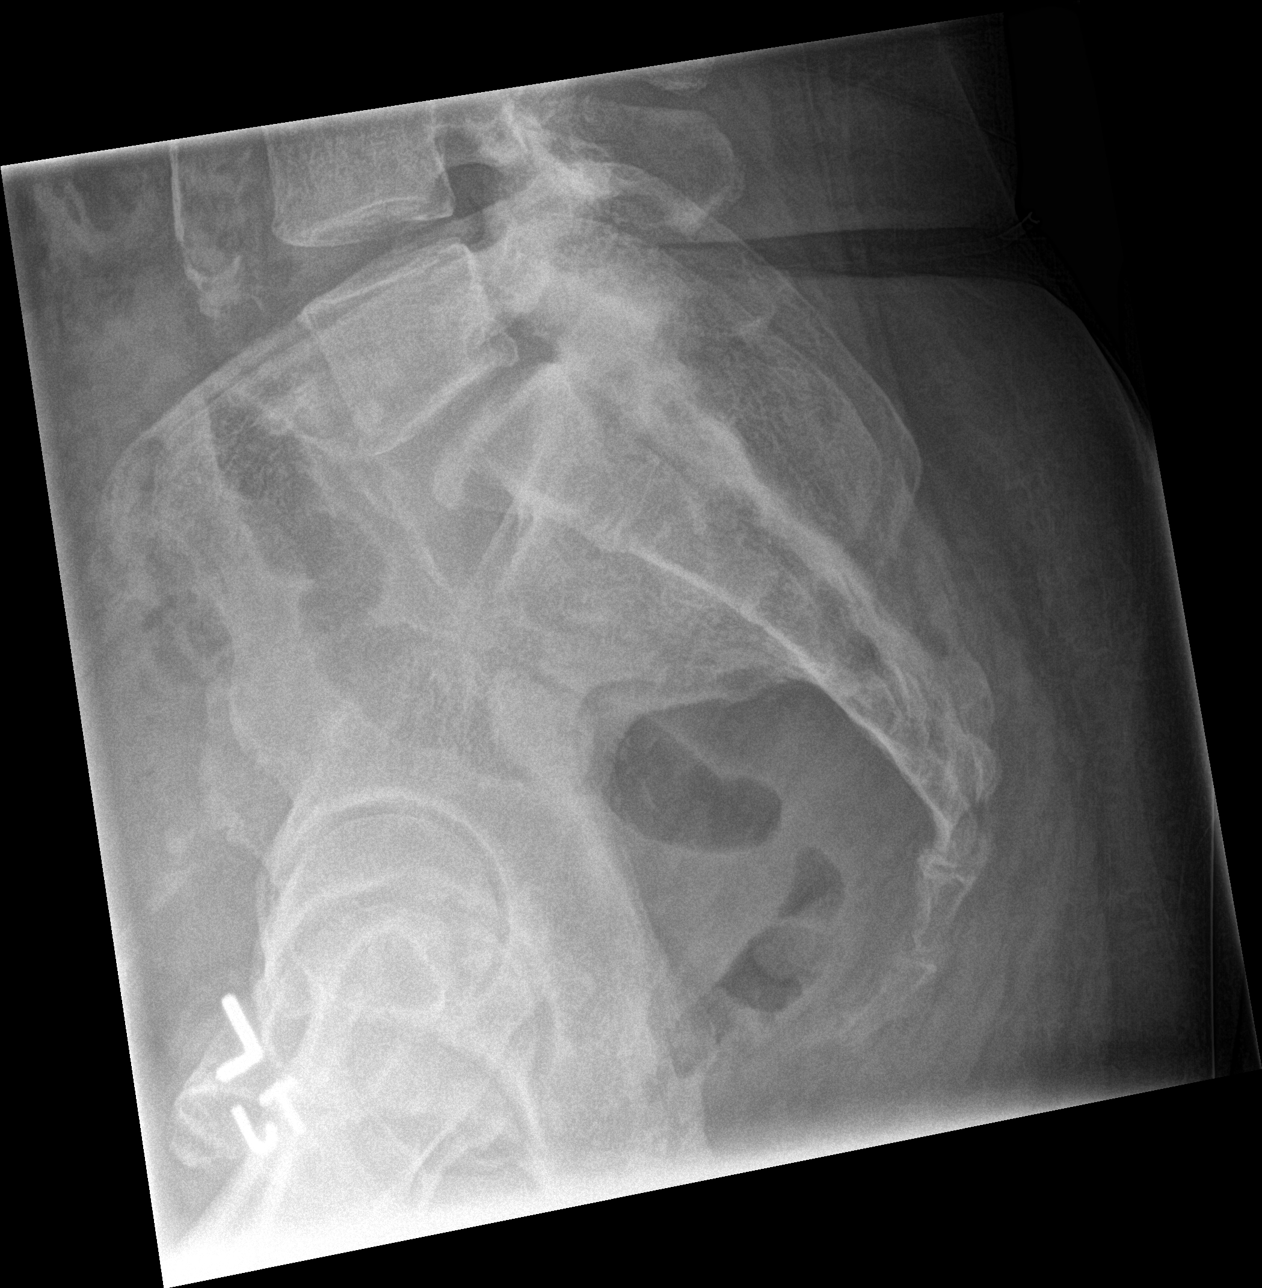

[5 of 5 positions shown; findings below may reference images not displayed]

FINDINGS: Frontal, lateral, spot lumbosacral lateral, and bilateral oblique
views were obtained. There are 5 non-rib-bearing lumbar type
vertebral bodies. No evident fracture. There is 6 mm of
anterolisthesis of L5 on S1. No other spondylolisthesis. There is no
appreciable disc space narrowing. There is facet osteoarthritic
change at L4-5 and L5-S1 bilaterally. There is aortic and iliac
artery atherosclerosis. There is incomplete visualization an
apparent calcified uterine leiomyoma measuring approximately 2 x 2
cm.
IMPRESSION: Facet osteoarthritic change at L4-5 and L5-S1 bilaterally. There is
6 mm of anterolisthesis of L5 on S1, likely due to underlying
spondylosis. No evident fracture. No appreciable disc space
narrowing.

Aortic Atherosclerosis ([id number]). Incomplete visualization of
apparent uterine leiomyoma measuring approximately 2 x 2 cm.

## 2023-05-08 ENCOUNTER — Other Ambulatory Visit: Payer: Self-pay | Admitting: Nurse Practitioner

## 2023-05-08 DIAGNOSIS — I10 Essential (primary) hypertension: Secondary | ICD-10-CM

## 2023-05-27 ENCOUNTER — Other Ambulatory Visit: Payer: Self-pay | Admitting: Nurse Practitioner

## 2023-05-27 DIAGNOSIS — G8929 Other chronic pain: Secondary | ICD-10-CM

## 2023-05-29 ENCOUNTER — Other Ambulatory Visit: Payer: Self-pay | Admitting: Nurse Practitioner

## 2023-05-29 DIAGNOSIS — G8929 Other chronic pain: Secondary | ICD-10-CM

## 2023-07-18 ENCOUNTER — Other Ambulatory Visit: Payer: Self-pay | Admitting: Nurse Practitioner

## 2023-07-18 DIAGNOSIS — Z87898 Personal history of other specified conditions: Secondary | ICD-10-CM

## 2023-08-13 ENCOUNTER — Other Ambulatory Visit: Payer: Self-pay | Admitting: Nurse Practitioner

## 2023-08-13 DIAGNOSIS — I10 Essential (primary) hypertension: Secondary | ICD-10-CM

## 2023-09-10 ENCOUNTER — Ambulatory Visit: Payer: Medicare (Managed Care) | Admitting: Nurse Practitioner

## 2023-09-11 ENCOUNTER — Ambulatory Visit: Payer: Medicare (Managed Care) | Admitting: Nurse Practitioner

## 2023-09-26 ENCOUNTER — Ambulatory Visit (INDEPENDENT_AMBULATORY_CARE_PROVIDER_SITE_OTHER): Payer: Medicare (Managed Care) | Admitting: Nurse Practitioner

## 2023-09-26 ENCOUNTER — Telehealth: Payer: Self-pay

## 2023-09-26 VITALS — BP 118/68 | HR 90 | Temp 98.4°F | Ht 65.0 in | Wt 186.4 lb

## 2023-09-26 DIAGNOSIS — Z711 Person with feared health complaint in whom no diagnosis is made: Secondary | ICD-10-CM

## 2023-09-26 DIAGNOSIS — R531 Weakness: Secondary | ICD-10-CM

## 2023-09-26 DIAGNOSIS — L729 Follicular cyst of the skin and subcutaneous tissue, unspecified: Secondary | ICD-10-CM | POA: Diagnosis not present

## 2023-09-26 DIAGNOSIS — R29898 Other symptoms and signs involving the musculoskeletal system: Secondary | ICD-10-CM | POA: Diagnosis not present

## 2023-09-26 LAB — COMPREHENSIVE METABOLIC PANEL WITH GFR
ALT: 14 U/L (ref 0–35)
AST: 23 U/L (ref 0–37)
Albumin: 4.5 g/dL (ref 3.5–5.2)
Alkaline Phosphatase: 57 U/L (ref 39–117)
BUN: 13 mg/dL (ref 6–23)
CO2: 33 meq/L — ABNORMAL HIGH (ref 19–32)
Calcium: 10.2 mg/dL (ref 8.4–10.5)
Chloride: 100 meq/L (ref 96–112)
Creatinine, Ser: 1.3 mg/dL — ABNORMAL HIGH (ref 0.40–1.20)
GFR: 39.95 mL/min — ABNORMAL LOW (ref >60.00)
Glucose, Bld: 123 mg/dL — ABNORMAL HIGH (ref 70–99)
Potassium: 4 meq/L (ref 3.5–5.1)
Sodium: 142 meq/L (ref 135–145)
Total Bilirubin: 0.4 mg/dL (ref 0.2–1.2)
Total Protein: 7.8 g/dL (ref 6.0–8.3)

## 2023-09-26 LAB — CBC
HCT: 39.8 % (ref 36.0–46.0)
Hemoglobin: 12.9 g/dL (ref 12.0–15.0)
MCHC: 32.4 g/dL (ref 30.0–36.0)
MCV: 86.6 fl (ref 78.0–100.0)
Platelets: 270 10*3/uL (ref 150.0–400.0)
RBC: 4.59 Mil/uL (ref 3.87–5.11)
RDW: 13.6 % (ref 11.5–15.5)
WBC: 6.3 10*3/uL (ref 4.0–10.5)

## 2023-09-26 LAB — IBC + FERRITIN
Ferritin: 177.7 ng/mL (ref 10.0–291.0)
Iron: 137 ug/dL (ref 42–145)
Saturation Ratios: 42.5 % (ref 20.0–50.0)
TIBC: 322 ug/dL (ref 250.0–450.0)
Transferrin: 230 mg/dL (ref 212.0–360.0)

## 2023-09-26 LAB — TSH: TSH: 3.08 u[IU]/mL (ref 0.35–5.50)

## 2023-09-26 NOTE — Patient Instructions (Signed)
Nice to see you today I will be in touch with the labs once I have them Follow up with me in 4 months for your physical, sooner if you need me

## 2023-09-26 NOTE — Assessment & Plan Note (Signed)
 Stable patient has no new concerns

## 2023-09-26 NOTE — Assessment & Plan Note (Signed)
 Not infected at current juncture.  Note for long periods of time referral to dermatology for cyst excision

## 2023-09-26 NOTE — Progress Notes (Signed)
 Established Patient Office Visit  Subjective   Patient ID: Teresa Fry, female    DOB: June 12, 1945  Age: 78 y.o. MRN: [id number]  Chief Complaint  Patient presents with   Follow-up    Memory recheck. Pt complains she's been okay but still having concerns.     HPI  Memory Concern: Patient was last seen by me on 03/12/2019 for with concern about memory.  MMSE was done patient scored a 29 out of 30.  Patient is concerned because her memory was decreasing over the 6 months.  We did have basic labs checked that all came back relatively normal.  Patient also recommended to take a holiday off of lovastatin , she did not.  States that her memory had improved since coming off the lovastatin .  She is also started on Neurivia.  She is here for recheck. States that she has no new concerns  Legs:states that she noticed that her legs were bothering her when she goes to bed. States that it feels like nervous legs. States that it happens during the day. Statse that the legs felt weak like she was going to fall and then she was ok. It last for a couple mins. No pain. Some numbness but sensatoin came back. States that she has had it happen twice during the day. States that the it happens to the left leg at night for over a month. Has not tried anything over the counter    Review of Systems  Constitutional:  Negative for chills and fever.  Respiratory:  Negative for shortness of breath.   Cardiovascular:  Negative for chest pain.  Gastrointestinal:        BM ever 2 days  Neurological:  Negative for headaches.  Psychiatric/Behavioral:  Negative for hallucinations and suicidal ideas.       Objective:     BP 118/68   Pulse 90   Temp 98.4 F (36.9 C) (Oral)   Ht 5' 5 (1.651 m)   Wt 186 lb 6.4 oz (84.6 kg)   SpO2 95%   BMI 31.02 kg/m  BP Readings from Last 3 Encounters:  09/26/23 118/68  03/12/23 120/88  01/09/23 128/76   Wt Readings from Last 3 Encounters:  09/26/23 186 lb 6.4 oz (84.6 kg)   03/12/23 190 lb 3.2 oz (86.3 kg)  01/09/23 191 lb 6.4 oz (86.8 kg)   SpO2 Readings from Last 3 Encounters:  09/26/23 95%  03/12/23 94%  01/09/23 91%      Physical Exam Vitals and nursing note reviewed.  Constitutional:      Appearance: Normal appearance.   Cardiovascular:     Rate and Rhythm: Normal rate and regular rhythm.     Heart sounds: Normal heart sounds.  Pulmonary:     Effort: Pulmonary effort is normal.     Breath sounds: Normal breath sounds.   Skin:      Neurological:     Mental Status: She is alert.     Motor: No weakness.     Gait: Gait is intact.     Deep Tendon Reflexes:     Reflex Scores:      Patellar reflexes are 1+ on the right side and 1+ on the left side.    Comments: Bilateral upper and lower extremity strength 5/5     No results found for any visits on 09/26/23.    The 10-year ASCVD risk score (Arnett DK, et al., 2019) is: 14.4%    Assessment & Plan:   Problem List  Items Addressed This Visit       Musculoskeletal and Integument   Cyst of skin   Not infected at current juncture.  Note for long periods of time referral to dermatology for cyst excision      Relevant Orders   Ambulatory referral to Dermatology     Other   Concern about memory   Stable patient has no new concerns      Weakness of both lower extremities - Primary   Ambiguous nature.  Will check basic blood work inclusive of CBC, CMP, iron studies.  If not non revealing consider getting an x-ray of the back.      Relevant Orders   CBC   Comprehensive metabolic panel with GFR   IBC + Ferritin   TSH    Return in about 4 months (around 01/26/2024) for CPE and Labs.    Adina Crandall, NP

## 2023-09-26 NOTE — Telephone Encounter (Signed)
 Magnesium oxide 200mg  daily. Brand does not matter

## 2023-09-26 NOTE — Telephone Encounter (Signed)
 Copied from CRM [id number]. Topic: Clinical - Medication Question >> Sep 26, 2023  2:36 PM [place] E wrote: Reason for CRM: Patient stated at her visit today with PCP he recommended Magnesium, patient questioning the specific brand name of med to pick up and which dosage she should get. Callback number [phone].

## 2023-09-26 NOTE — Assessment & Plan Note (Signed)
 Ambiguous nature.  Will check basic blood work inclusive of CBC, CMP, iron studies.  If not non revealing consider getting an x-ray of the back.

## 2023-09-26 NOTE — Telephone Encounter (Signed)
 Called pt and relayed information.  Pt verbalized understanding and has no questions or concerns.

## 2023-09-27 ENCOUNTER — Ambulatory Visit: Payer: Self-pay | Admitting: Nurse Practitioner

## 2023-09-27 DIAGNOSIS — R944 Abnormal results of kidney function studies: Secondary | ICD-10-CM

## 2023-10-10 DIAGNOSIS — Z820 Family history of epilepsy and other diseases of the nervous system: Secondary | ICD-10-CM | POA: Diagnosis not present

## 2023-10-10 DIAGNOSIS — Z315 Encounter for genetic counseling: Secondary | ICD-10-CM | POA: Diagnosis not present

## 2023-10-10 DIAGNOSIS — G43E19 Chronic migraine with aura, intractable, without status migrainosus: Secondary | ICD-10-CM | POA: Diagnosis not present

## 2023-10-10 DIAGNOSIS — Z1371 Encounter for nonprocreative screening for genetic disease carrier status: Secondary | ICD-10-CM | POA: Diagnosis not present

## 2023-10-11 ENCOUNTER — Other Ambulatory Visit: Payer: Medicare (Managed Care)

## 2023-10-12 ENCOUNTER — Other Ambulatory Visit: Payer: Self-pay | Admitting: Nurse Practitioner

## 2023-10-12 DIAGNOSIS — E7800 Pure hypercholesterolemia, unspecified: Secondary | ICD-10-CM

## 2023-10-15 ENCOUNTER — Other Ambulatory Visit (INDEPENDENT_AMBULATORY_CARE_PROVIDER_SITE_OTHER): Payer: Medicare (Managed Care)

## 2023-10-15 DIAGNOSIS — R944 Abnormal results of kidney function studies: Secondary | ICD-10-CM

## 2023-10-15 LAB — BASIC METABOLIC PANEL WITH GFR
BUN: 11 mg/dL (ref 6–23)
CO2: 32 meq/L (ref 19–32)
Calcium: 9.9 mg/dL (ref 8.4–10.5)
Chloride: 101 meq/L (ref 96–112)
Creatinine, Ser: 1.2 mg/dL (ref 0.40–1.20)
GFR: 43.96 mL/min — ABNORMAL LOW (ref >60.00)
Glucose, Bld: 100 mg/dL — ABNORMAL HIGH (ref 70–99)
Potassium: 4 meq/L (ref 3.5–5.1)
Sodium: 142 meq/L (ref 135–145)

## 2023-10-17 ENCOUNTER — Ambulatory Visit: Payer: Self-pay | Admitting: Nurse Practitioner

## 2023-11-13 ENCOUNTER — Ambulatory Visit: Payer: Medicare (Managed Care)

## 2023-11-13 ENCOUNTER — Telehealth: Payer: Self-pay

## 2023-11-13 VITALS — BP 118/86 | Ht 65.0 in | Wt 180.0 lb

## 2023-11-13 DIAGNOSIS — Z2821 Immunization not carried out because of patient refusal: Secondary | ICD-10-CM

## 2023-11-13 DIAGNOSIS — Z0000 Encounter for general adult medical examination without abnormal findings: Secondary | ICD-10-CM

## 2023-11-13 NOTE — Patient Instructions (Signed)
 Ms. Borgen , Thank you for taking time out of your busy schedule to complete your Annual Wellness Visit with me. I enjoyed our conversation and look forward to speaking with you again next year. I, as well as your care team,  appreciate your ongoing commitment to your health goals. Please review the following plan we discussed and let me know if I can assist you in the future. Your Game plan/ To Do List    Referrals: If you haven't heard from the office you've been referred to, please reach out to them at the phone provided.   Follow up Visits: We will see or speak with you next year for your Next Medicare AWV with our clinical staff Have you seen your provider in the last 6 months (3 months if uncontrolled diabetes)? Yes  Clinician Recommendations:  Aim for 30 minutes of exercise or brisk walking, 6-8 glasses of water, and 5 servings of fruits and vegetables each day.       This is a list of the screenings recommended for you:  Health Maintenance  Topic Date Due   DTaP/Tdap/Td vaccine (1 - Tdap) Never done   Zoster (Shingles) Vaccine (1 of 2) Never done   COVID-19 Vaccine (4 - 2024-25 season) 12/03/2022   Flu Shot  11/02/2023   Medicare Annual Wellness Visit  11/12/2024   Pneumococcal Vaccine for age over 54  Completed   DEXA scan (bone density measurement)  Completed   Hepatitis C Screening  Completed   Hepatitis B Vaccine  Aged Out   HPV Vaccine  Aged Out   Meningitis B Vaccine  Aged Out   Cologuard (Stool DNA test)  Discontinued    Advanced directives: (Declined) Advance directive discussed with you today. Even though you declined this today, please call our office should you change your mind, and we can give you the proper paperwork for you to fill out. Advance Care Planning is important because it:  [x]  Makes sure you receive the medical care that is consistent with your values, goals, and preferences  [x]  It provides guidance to your family and loved ones and reduces their  decisional burden about whether or not they are making the right decisions based on your wishes.  Follow the link provided in your after visit summary or read over the paperwork we have mailed to you to help you started getting your Advance Directives in place. If you need assistance in completing these, please reach out to us  so that we can help you!  See attachments for Preventive Care and Fall Prevention Tips.

## 2023-11-13 NOTE — Progress Notes (Signed)
 Because this visit was a virtual/telehealth visit,  certain criteria was not obtained, such a blood pressure, CBG if applicable, and timed get up and go. Any medications not marked as taking were not mentioned during the medication reconciliation part of the visit. Any vitals not documented were not able to be obtained due to this being a telehealth visit or patient was unable to self-report a recent blood pressure reading due to a lack of equipment at home via telehealth. Vitals that have been documented are verbally provided by the patient.   This visit was performed by a medical professional under my direct supervision. I was immediately available for consultation/collaboration. I have reviewed and agree with the Annual Wellness Visit documentation.  Subjective:   Viktoriya Glaspy is a 78 y.o. who presents for a Medicare Wellness preventive visit.  As a reminder, Annual Wellness Visits don't include a physical exam, and some assessments may be limited, especially if this visit is performed virtually. We may recommend an in-person follow-up visit with your provider if needed.  Visit Complete: Virtual I connected with  Markiya Keefe on 11/13/23 by a audio enabled telemedicine application and verified that I am speaking with the correct person using two identifiers.  Patient Location: Home  Provider Location: Home Office  I discussed the limitations of evaluation and management by telemedicine. The patient expressed understanding and agreed to proceed.  Vital Signs: Because this visit was a virtual/telehealth visit, some criteria may be missing or patient reported. Any vitals not documented were not able to be obtained and vitals that have been documented are patient reported.  VideoDeclined- This patient declined Librarian, academic. Therefore the visit was completed with audio only.  Persons Participating in Visit: Patient.  AWV Questionnaire: No: Patient Medicare AWV  questionnaire was not completed prior to this visit.  Cardiac Risk Factors include: advanced age (>51men, >35 women);hypertension;dyslipidemia     Objective:    Today's Vitals   11/13/23 1352  BP: 118/86  Weight: 180 lb (81.6 kg)  Height: 5' 5 (1.651 m)   Body mass index is 29.95 kg/m.     11/13/2023    1:52 PM 10/11/2022   11:00 AM 08/08/2021    2:30 PM  Advanced Directives  Does Patient Have a Medical Advance Directive? No No No  Would patient like information on creating a medical advance directive? No - Patient declined No - Patient declined     Current Medications (verified) Outpatient Encounter Medications as of 11/13/2023  Medication Sig   amLODipine (NORVASC) 10 MG tablet Take 1 tablet by mouth once daily   aspirin 81 MG tablet Take 81 mg by mouth daily.   diclofenac Sodium (VOLTAREN) 1 % GEL Apply 2 g topically 3 (three) times daily as needed. To your shoulder   lisinopril (ZESTRIL) 40 MG tablet Take 1 tablet by mouth once daily   lovastatin (MEVACOR) 10 MG tablet TAKE 1 TABLET BY MOUTH AT BEDTIME   meclizine (ANTIVERT) 12.5 MG tablet TAKE 1 TABLET BY MOUTH THREE TIMES DAILY AS NEEDED FOR DIZZINESS   meloxicam (MOBIC) 15 MG tablet Take 1 tablet by mouth once daily   MISC NATURAL PRODUCTS PO Take 1 tablet by mouth daily. Neuriva   Multiple Vitamin (MULTIVITAMIN) capsule Take 1 capsule by mouth daily.   spironolactone (ALDACTONE) 25 MG tablet Take 1 tablet by mouth once daily   No facility-administered encounter medications on file as of 11/13/2023.    Allergies (verified) Patient has no known allergies.  History: Past Medical History:  Diagnosis Date   Gallstones    Hypertension    Past Surgical History:  Procedure Laterality Date   CHOLECYSTECTOMY  02/03/11   Family History  Problem Relation Age of Onset   Heart disease Father    Cancer Daughter 33       breast   Social History   Socioeconomic History   Marital status: Married    Spouse name:  Lynwood   Number of children: 2   Years of education: Not on file   Highest education level: Some college, no degree  Occupational History   Not on file  Tobacco Use   Smoking status: Never   Smokeless tobacco: Never  Vaping Use   Vaping status: Never Used  Substance and Sexual Activity   Alcohol use: No    Alcohol/week: 0.0 standard drinks of alcohol   Drug use: No   Sexual activity: Never  Other Topics Concern   Not on file  Social History Narrative   Retired: work at the school   Social Drivers of Corporate investment banker Strain: Low Risk  (11/13/2023)   Overall Financial Resource Strain (CARDIA)    Difficulty of Paying Living Expenses: Not very hard  Food Insecurity: No Food Insecurity (11/13/2023)   Hunger Vital Sign    Worried About Running Out of Food in the Last Year: Never true    Ran Out of Food in the Last Year: Never true  Transportation Needs: No Transportation Needs (11/13/2023)   PRAPARE - Administrator, Civil Service (Medical): No    Lack of Transportation (Non-Medical): No  Physical Activity: Insufficiently Active (11/13/2023)   Exercise Vital Sign    Days of Exercise per Week: 2 days    Minutes of Exercise per Session: 30 min  Stress: No Stress Concern Present (11/13/2023)   [employer] of Occupational Health - Occupational Stress Questionnaire    Feeling of Stress: Not at all  Social Connections: Moderately Integrated (11/13/2023)   Social Connection and Isolation Panel    Frequency of Communication with Friends and Family: More than three times a week    Frequency of Social Gatherings with Friends and Family: More than three times a week    Attends Religious Services: More than 4 times per year    Active Member of [employer] or Organizations: Patient declined    Attends Banker Meetings: Never    Marital Status: Married    Tobacco Counseling Counseling given: Not Answered    Clinical Intake:  Pre-visit preparation  completed: Yes  Pain : No/denies pain     BMI - recorded: 29.95 Nutritional Status: BMI 25 -29 Overweight Nutritional Risks: None Diabetes: No  Lab Results  Component Value Date   HGBA1C 5.6 01/09/2023   HGBA1C 5.6 12/05/2019     How often do you need to have someone help you when you read instructions, pamphlets, or other written materials from your doctor or pharmacy?: 1 - Never What is the last grade level you completed in school?: Some college  Interpreter Needed?: No  Information entered by :: Trayquan Kolakowski,CMA   Activities of Daily Living     11/13/2023    1:55 PM  In your present state of health, do you have any difficulty performing the following activities:  Hearing? 0  Vision? 0  Difficulty concentrating or making decisions? 0  Walking or climbing stairs? 0  Dressing or bathing? 0  Doing errands, shopping? 0  Preparing  Food and eating ? N  Using the Toilet? N  In the past six months, have you accidently leaked urine? N  Do you have problems with loss of bowel control? N  Managing your Medications? N  Managing your Finances? N  Housekeeping or managing your Housekeeping? N    Patient Care Team: Wendee Lynwood HERO, NP as PCP - General  I have updated your Care Teams any recent Medical Services you may have received from other providers in the past year.     Assessment:   This is a routine wellness examination for Kemiah.  Hearing/Vision screen Hearing Screening - Comments:: No difficulties Vision Screening - Comments:: Patient wears glasses    Goals Addressed             This Visit's Progress    Patient Stated   On track    08/08/2021, wants to lose weight       Depression Screen     11/13/2023    1:56 PM 09/26/2023   11:35 AM 03/12/2023   12:01 PM 10/11/2022   10:59 AM 06/05/2022    8:31 AM 08/08/2021    2:31 PM 12/08/2020   11:53 AM  PHQ 2/9 Scores  PHQ - 2 Score 1 2 0 0 2 0 0  PHQ- 9 Score 3 5 1  6       Fall Risk     11/13/2023    6:17  PM 09/26/2023   11:36 AM 10/11/2022   11:12 AM 10/11/2022   11:01 AM 06/05/2022    8:05 AM  Fall Risk   Falls in the past year? 0 0 0 0 0  Number falls in past yr: 0 0 0 0 0  Injury with Fall? 0 0 0 0 0  Risk for fall due to : No Fall Risks No Fall Risks No Fall Risks No Fall Risks No Fall Risks  Follow up Falls evaluation completed Falls evaluation completed Falls prevention discussed;Falls evaluation completed Falls prevention discussed;Falls evaluation completed Falls evaluation completed    MEDICARE RISK AT HOME:  Medicare Risk at Home Any stairs in or around the home?: Yes If so, are there any without handrails?: No Home free of loose throw rugs in walkways, pet beds, electrical cords, etc?: Yes Adequate lighting in your home to reduce risk of falls?: Yes Life alert?: No Use of a cane, walker or w/c?: No Grab bars in the bathroom?: Yes Shower chair or bench in shower?: Yes Elevated toilet seat or a handicapped toilet?: Yes  TIMED UP AND GO:  Was the test performed?  No  Cognitive Function: 6CIT completed    03/12/2023   12:20 PM  MMSE - Mini Mental State Exam  Orientation to time 5  Orientation to Place 4  Registration 3  Attention/ Calculation 5  Recall 3  Language- name 2 objects 2  Language- repeat 1  Language- follow 3 step command 3  Language- read & follow direction 1  Write a sentence 1  Copy design 1  Total score 29        10/11/2022   11:02 AM 08/08/2021    2:33 PM  6CIT Screen  What Year? 0 points 0 points  What month? 0 points 0 points  What time? 0 points 0 points  Count back from 20 0 points 0 points  Months in reverse 0 points 0 points  Repeat phrase 0 points 0 points  Total Score 0 points 0 points    Immunizations Immunization History  Administered Date(s) Administered   Fluad Quad(high Dose 65+) 12/08/2020   Influenza, High Dose Seasonal PF 03/12/2018   Influenza,inj,Quad PF,6+ Mos 01/31/2017   PFIZER(Purple Top)SARS-COV-2 Vaccination  09/02/2019, 09/23/2019, 04/01/2020   Pneumococcal Conjugate-13 02/20/2017   Pneumococcal Polysaccharide-23 11/07/2018    Screening Tests Health Maintenance  Topic Date Due   DTaP/Tdap/Td (1 - Tdap) Never done   Zoster Vaccines- Shingrix (1 of 2) Never done   COVID-19 Vaccine (4 - 2024-25 season) 12/03/2022   INFLUENZA VACCINE  11/02/2023   Medicare Annual Wellness (AWV)  11/12/2024   Pneumococcal Vaccine: 50+ Years  Completed   DEXA SCAN  Completed   Hepatitis C Screening  Completed   Hepatitis B Vaccines  Aged Out   HPV VACCINES  Aged Out   Meningococcal B Vaccine  Aged Out   Fecal DNA (Cologuard)  Discontinued    Health Maintenance  Health Maintenance Due  Topic Date Due   DTaP/Tdap/Td (1 - Tdap) Never done   Zoster Vaccines- Shingrix (1 of 2) Never done   COVID-19 Vaccine (4 - 2024-25 season) 12/03/2022   INFLUENZA VACCINE  11/02/2023   Health Maintenance Items Addressed:   Additional Screening:  Vision Screening: Recommended annual ophthalmology exams for early detection of glaucoma and other disorders of the eye. Would you like a referral to an eye doctor? No    Dental Screening: Recommended annual dental exams for proper oral hygiene  Community Resource Referral / Chronic Care Management: CRR required this visit?  No   CCM required this visit?  No   Plan:    I have personally reviewed and noted the following in the patient's chart:   Medical and social history Use of alcohol, tobacco or illicit drugs  Current medications and supplements including opioid prescriptions. Patient is not currently taking opioid prescriptions. Functional ability and status Nutritional status Physical activity Advanced directives List of other physicians Hospitalizations, surgeries, and ER visits in previous 12 months Vitals Screenings to include cognitive, depression, and falls Referrals and appointments  In addition, I have reviewed and discussed with patient certain  preventive protocols, quality metrics, and best practice recommendations. A written personalized care plan for preventive services as well as general preventive health recommendations were provided to patient.   Lyle MARLA Right, [place]   11/13/2023   After Visit Summary: (MyChart) Due to this being a telephonic visit, the after visit summary with patients personalized plan was offered to patient via MyChart   Notes: Nothing significant to report at this time.

## 2023-11-13 NOTE — Telephone Encounter (Signed)
 Copied from CRM [id number]. Topic: General - Call Back - No Documentation >> Nov 13, 2023  1:50 PM Harlene ORN wrote: Reason for CRM: Patient is calling to follow up on her telephone visit at 1:40 pm today.

## 2024-01-28 ENCOUNTER — Ambulatory Visit: Payer: Self-pay

## 2024-01-28 NOTE — Telephone Encounter (Signed)
 FYI Only or Action Required?: FYI only for provider.  Patient was last seen in primary care on 09/26/2023 by Wendee Lynwood HERO, NP.  Called Nurse Triage reporting Back Pain.  Symptoms began several days ago.  Interventions attempted: Nothing.  Symptoms are: unchanged.  Triage Disposition: See PCP When Office is Open (Within 3 Days)  Patient/caregiver understands and will follow disposition?: Yes     Copied from CRM #[id number]. Topic: Clinical - Red Word Triage >> Jan 28, 2024  4:47 PM Alfonso HERO wrote: Patient has past kidney issues and is having pain in her back and is concerned. Reason for Disposition  [1] MODERATE back pain (e.g., interferes with normal activities) AND [2] present > 3 days  Answer Assessment - Initial Assessment Questions Last time at the doctor said kidney functions were not right and she was wanting to be seen to get that checked out but is also having some low back pain. She stats sometimes her stomach also feels bloated.     1. ONSET: When did the pain begin? (e.g., minutes, hours, days)     A couple of months 2. LOCATION: Where does it hurt? (upper, mid or lower back)     Left side to the middle of back 3. SEVERITY: How bad is the pain?  (e.g., Scale 1-10; mild, moderate, or severe)     Today worse- 6, feels like she got hit on the back 4. PATTERN: Is the pain constant? (e.g., yes, no; constant, intermittent)      constant 5. RADIATION: Does the pain shoot into your legs or somewhere else?     No  6. CAUSE:  What do you think is causing the back pain?      unknown 7. BACK OVERUSE:  Any recent lifting of heavy objects, strenuous work or exercise?     no 8. MEDICINES: What have you taken so far for the pain? (e.g., nothing, acetaminophen, NSAIDS)     no 9. NEUROLOGIC SYMPTOMS: Do you have any weakness, numbness, or problems with bowel/bladder control?     Weakness a couple of weeks or more and cramping in legs (both) 10. OTHER SYMPTOMS:  Do you have any other symptoms? (e.g., fever, abdomen pain, burning with urination, blood in urine)       no  Protocols used: Back Pain-A-AH

## 2024-01-29 NOTE — Telephone Encounter (Signed)
 Noted.  Appreciate Bableen's evaluation.

## 2024-01-30 ENCOUNTER — Ambulatory Visit: Payer: Self-pay | Admitting: General Practice

## 2024-01-30 ENCOUNTER — Encounter: Payer: Self-pay | Admitting: General Practice

## 2024-01-30 ENCOUNTER — Ambulatory Visit (INDEPENDENT_AMBULATORY_CARE_PROVIDER_SITE_OTHER): Payer: Medicare (Managed Care) | Admitting: General Practice

## 2024-01-30 VITALS — BP 118/66 | HR 76 | Temp 98.3°F | Ht 65.0 in | Wt 186.0 lb

## 2024-01-30 DIAGNOSIS — G8929 Other chronic pain: Secondary | ICD-10-CM | POA: Insufficient documentation

## 2024-01-30 DIAGNOSIS — M545 Low back pain, unspecified: Secondary | ICD-10-CM

## 2024-01-30 DIAGNOSIS — R944 Abnormal results of kidney function studies: Secondary | ICD-10-CM

## 2024-01-30 DIAGNOSIS — R8290 Unspecified abnormal findings in urine: Secondary | ICD-10-CM | POA: Diagnosis not present

## 2024-01-30 LAB — POC URINALSYSI DIPSTICK (AUTOMATED)
Bilirubin, UA: NEGATIVE
Blood, UA: NEGATIVE
Glucose, UA: NEGATIVE
Ketones, UA: NEGATIVE
Nitrite, UA: NEGATIVE
Protein, UA: POSITIVE — AB
Spec Grav, UA: 1.02 (ref 1.010–1.025)
Urobilinogen, UA: 0.2 U/dL
pH, UA: 5.5 (ref 5.0–8.0)

## 2024-01-30 LAB — BASIC METABOLIC PANEL WITH GFR
BUN: 13 mg/dL (ref 6–23)
CO2: 29 meq/L (ref 19–32)
Calcium: 10.1 mg/dL (ref 8.4–10.5)
Chloride: 101 meq/L (ref 96–112)
Creatinine, Ser: 1.39 mg/dL — ABNORMAL HIGH (ref 0.40–1.20)
GFR: 36.78 mL/min — ABNORMAL LOW (ref >60.00)
Glucose, Bld: 102 mg/dL — ABNORMAL HIGH (ref 70–99)
Potassium: 4.2 meq/L (ref 3.5–5.1)
Sodium: 139 meq/L (ref 135–145)

## 2024-01-30 MED ORDER — CELECOXIB 100 MG PO CAPS: 100.0000 mg | ORAL_CAPSULE | Freq: Every day | ORAL | 0 refills | Status: AC | PRN

## 2024-01-30 NOTE — Patient Instructions (Addendum)
 Stop by the lab prior to leaving today. I will notify you of your results once received.   I will be in touch regarding the urine culture.   Start Celebrex 100 mg as needed for back pain.   Continue tylenol as needed.   Consider physical and therapy and update me or Matt if you want to try that.   Follow up in 4 weeks for back pain with PCP.  It was a pleasure meeting you!

## 2024-01-30 NOTE — Progress Notes (Signed)
 Established Patient Office Visit  Subjective   Patient ID: Teresa Fry, female    DOB: [birth year]  Age: 78 y.o. MRN: [id number]  Chief Complaint  Patient presents with   Back Pain    X 1-2 years. No past injury or current injury. Patient had been taking meloxicam  but kidney function was down and was told to stop and now taking tylenol only when the pain is really bad. Has not tried ice or heat to back.    Flank Pain    Urine is also very yellow and a funny smell to her; started a little before her last visit with matt when her labs where done. Patient wants to discuss rechecking her kidney function today as well.     Back Pain Pertinent negatives include no abdominal pain, chest pain, dysuria, fever or headaches.  Flank Pain Pertinent negatives include no abdominal pain, chest pain, dysuria, fever or headaches.    Teresa Fry is a 78 year old female, patient of Adina Crandall, NP, with past medical history of HTN HLD, obesity, vertigo, chronic shoulder pain presents today for an acute visit to discuss back pain and urine odor.   Discussed the use of AI scribe software for clinical note transcription with the patient, who gave verbal consent to proceed.  History of Present Illness Teresa Fry is a 78 year old female with chronic back pain who presents with worsening symptoms and concerns about kidney function.  She has experienced chronic back pain for many years, initially evaluated in 2022. X-rays were performed at that time, and she was started on meloxicam . However, due to concerns about kidney function, she discontinued meloxicam  in July after tests indicated decreased kidney function. Since then, she has been managing her back pain with Tylenol 500 mg as needed. Despite this, she experiences persistent pain, particularly in the lower back, described as a 'dull constant pain' that does not radiate to her legs. The pain is sometimes severe, especially at night, and she occasionally feels  swelling on the left side.  No burning sensation during urination, visible blood in urine, or vaginal symptoms. However, she reports a noticeable odor in her urine and occasional nausea. No fever, chills, vomiting, chest pain, dizziness, or new headaches.  Her current medications include lisinopril , spironolactone , and amlodipine  for hypertension. She questions whether these medications could affect her kidney function, but she does not regularly take NSAIDs other than the previously prescribed meloxicam .    Patient Active Problem List   Diagnosis Date Noted   Weakness of both lower extremities 09/26/2023   Cyst of skin 09/26/2023   Muscle cramps 03/12/2023   Concern about memory 01/09/2023   History of vertigo 06/05/2022   Decreased GFR 06/05/2022   Preventative health care 10/10/2021   Chronic right shoulder pain 07/01/2021   Hyperlipidemia 12/17/2017   Obesity (BMI 30-39.9) 12/17/2017   Essential hypertension 03/10/2015   Past Medical History:  Diagnosis Date   Gallstones    Hypertension    Past Surgical History:  Procedure Laterality Date   CHOLECYSTECTOMY  02/03/11   No Known Allergies       11/13/2023    1:56 PM 09/26/2023   11:35 AM 03/12/2023   12:01 PM  Depression screen PHQ 2/9  Decreased Interest 0 2 0  Down, Depressed, Hopeless 1 0 0  PHQ - 2 Score 1 2 0  Altered sleeping 1 1 0  Tired, decreased energy 1 1 1   Change in appetite 0 1 0  Feeling  bad or failure about yourself  0 0 0  Trouble concentrating 0 0 0  Moving slowly or fidgety/restless 0 0 0  Suicidal thoughts 0 0 0  PHQ-9 Score 3 5 1   Difficult doing work/chores Not difficult at all Not difficult at all Not difficult at all       09/26/2023   11:35 AM 03/12/2023   11:59 AM 06/05/2022    8:31 AM  GAD 7 : Generalized Anxiety Score  Nervous, Anxious, on Edge 1 0 0  Control/stop worrying 0 1 0  Worry too much - different things 0 0 0  Trouble relaxing 1 0 0  Restless 0 0 0  Easily annoyed or  irritable 0 0 0  Afraid - awful might happen 0 0 0  Total GAD 7 Score 2 1 0  Anxiety Difficulty Not difficult at all Not difficult at all Not difficult at all      Review of Systems  Constitutional:  Negative for chills and fever.  Respiratory:  Negative for shortness of breath.   Cardiovascular:  Negative for chest pain.  Gastrointestinal:  Positive for nausea. Negative for abdominal pain, constipation, diarrhea, heartburn and vomiting.  Genitourinary:  Positive for flank pain. Negative for dysuria, frequency, hematuria and urgency.       Urinary odor  Musculoskeletal:  Positive for back pain.  Neurological:  Negative for dizziness and headaches.  Endo/Heme/Allergies:  Negative for polydipsia.  Psychiatric/Behavioral:  Negative for depression and suicidal ideas. The patient is not nervous/anxious.       Objective:     BP 118/66   Pulse 76   Temp 98.3 F (36.8 C) (Oral)   Ht 5' 5 (1.651 m)   Wt 186 lb (84.4 kg)   SpO2 98%   BMI 30.95 kg/m  BP Readings from Last 3 Encounters:  01/30/24 118/66  11/13/23 118/86  09/26/23 118/68   Wt Readings from Last 3 Encounters:  01/30/24 186 lb (84.4 kg)  11/13/23 180 lb (81.6 kg)  09/26/23 186 lb 6.4 oz (84.6 kg)      Physical Exam Vitals and nursing note reviewed.  Constitutional:      Appearance: Normal appearance.  Cardiovascular:     Rate and Rhythm: Normal rate and regular rhythm.     Pulses: Normal pulses.     Heart sounds: Normal heart sounds.  Pulmonary:     Effort: Pulmonary effort is normal.     Breath sounds: Normal breath sounds.  Abdominal:     General: Bowel sounds are normal. There is no distension.     Tenderness: There is no right CVA tenderness or left CVA tenderness.  Musculoskeletal:     Cervical back: Normal.     Thoracic back: Normal.     Lumbar back: No swelling, spasms or tenderness. Normal range of motion. Negative right straight leg raise test and negative left straight leg raise test.   Neurological:     Mental Status: She is alert and oriented to person, place, and time.  Psychiatric:        Mood and Affect: Mood normal.        Behavior: Behavior normal.        Thought Content: Thought content normal.        Judgment: Judgment normal.      No results found for any visits on 01/30/24.     The 10-year ASCVD risk score (Arnett DK, et al., 2019) is: 14.4%    Assessment & Plan:  Chronic  midline low back pain without sciatica -     Celecoxib; Take 1 capsule (100 mg total) by mouth daily as needed.  Dispense: 30 capsule; Refill: 0  Abnormal urine odor -     POCT Urinalysis Dipstick (Automated) -     CBC With Differential/Platelet -     Urine Culture  Decreased GFR Assessment & Plan: BMP pending.  Has been avoid NSAIDs and has increased water intake.  Orders: -     Basic metabolic panel with GFR    Assessment and Plan Assessment & Plan Chronic back pain, midline.  Pain is constant and dull, managed inadequately with Tylenol. - Start Celebrex 100 mg as needed for back pain. - Continue Tylenol as needed. - Consider physical therapy. - Follow up in four weeks with PCP.  Abnormal urinalysis POC UA shows 2+ leuks  - Order urine culture. - Advise increased fluid intake. - Await results.  Abnormal kidney function tests Potentially exacerbated by previous meloxicam  use.  - Recheck kidney function tests today.   Return in about 4 weeks (around 02/27/2024) for back pain - with PCP.    Carrol Aurora, NP

## 2024-01-30 NOTE — Assessment & Plan Note (Addendum)
 BMP pending.  Has been avoid NSAIDs and has increased water intake.

## 2024-01-31 LAB — CBC WITH DIFFERENTIAL/PLATELET
Absolute Lymphocytes: 1146 {cells}/uL (ref 850–3900)
Absolute Monocytes: 623 {cells}/uL (ref 200–950)
Basophils Absolute: 34 {cells}/uL (ref 0–200)
Basophils Relative: 0.5 %
Eosinophils Absolute: 134 {cells}/uL (ref 15–500)
Eosinophils Relative: 2 %
HCT: 40.6 % (ref 35.0–45.0)
Hemoglobin: 13 g/dL (ref 11.7–15.5)
MCH: 28.5 pg (ref 27.0–33.0)
MCHC: 32 g/dL (ref 32.0–36.0)
MCV: 89 fL (ref 80.0–100.0)
MPV: 10.2 fL (ref 7.5–12.5)
Monocytes Relative: 9.3 %
Neutro Abs: 4764 {cells}/uL (ref 1500–7800)
Neutrophils Relative %: 71.1 %
Platelets: 299 Thousand/uL (ref 140–400)
RBC: 4.56 Million/uL (ref 3.80–5.10)
RDW: 12.3 % (ref 11.0–15.0)
Total Lymphocyte: 17.1 %
WBC: 6.7 Thousand/uL (ref 3.8–10.8)

## 2024-01-31 LAB — URINE CULTURE
MICRO NUMBER:: 17164022
Result:: NO GROWTH
SPECIMEN QUALITY:: ADEQUATE

## 2024-01-31 LAB — TIQ-MISC

## 2024-02-21 ENCOUNTER — Ambulatory Visit (INDEPENDENT_AMBULATORY_CARE_PROVIDER_SITE_OTHER): Payer: Medicare (Managed Care) | Admitting: Nurse Practitioner

## 2024-02-21 ENCOUNTER — Encounter: Payer: Self-pay | Admitting: Nurse Practitioner

## 2024-02-21 VITALS — BP 102/60 | HR 75 | Temp 99.0°F | Ht 65.0 in | Wt 186.4 lb

## 2024-02-21 DIAGNOSIS — Z23 Encounter for immunization: Secondary | ICD-10-CM | POA: Diagnosis not present

## 2024-02-21 DIAGNOSIS — N183 Chronic kidney disease, stage 3 unspecified: Secondary | ICD-10-CM

## 2024-02-21 DIAGNOSIS — I10 Essential (primary) hypertension: Secondary | ICD-10-CM | POA: Diagnosis not present

## 2024-02-21 DIAGNOSIS — Z0000 Encounter for general adult medical examination without abnormal findings: Secondary | ICD-10-CM | POA: Diagnosis not present

## 2024-02-21 DIAGNOSIS — E785 Hyperlipidemia, unspecified: Secondary | ICD-10-CM | POA: Diagnosis not present

## 2024-02-21 LAB — CBC WITH DIFFERENTIAL/PLATELET
Basophils Absolute: 0.1 K/uL (ref 0.0–0.1)
Basophils Relative: 1.2 % (ref 0.0–3.0)
Eosinophils Absolute: 0.2 K/uL (ref 0.0–0.7)
Eosinophils Relative: 2.2 % (ref 0.0–5.0)
HCT: 38 % (ref 36.0–46.0)
Hemoglobin: 12.6 g/dL (ref 12.0–15.0)
Lymphocytes Relative: 14.4 % (ref 12.0–46.0)
Lymphs Abs: 1 K/uL (ref 0.7–4.0)
MCHC: 33.2 g/dL (ref 30.0–36.0)
MCV: 87 fl (ref 78.0–100.0)
Monocytes Absolute: 0.6 K/uL (ref 0.1–1.0)
Monocytes Relative: 9 % (ref 3.0–12.0)
Neutro Abs: 5.2 K/uL (ref 1.4–7.7)
Neutrophils Relative %: 73.2 % (ref 43.0–77.0)
Platelets: 299 K/uL (ref 150.0–400.0)
RBC: 4.37 Mil/uL (ref 3.87–5.11)
RDW: 14.5 % (ref 11.5–15.5)
WBC: 7.1 K/uL (ref 4.0–10.5)

## 2024-02-21 LAB — COMPREHENSIVE METABOLIC PANEL WITH GFR
ALT: 12 U/L (ref 0–35)
AST: 21 U/L (ref 0–37)
Albumin: 4.4 g/dL (ref 3.5–5.2)
Alkaline Phosphatase: 56 U/L (ref 39–117)
BUN: 14 mg/dL (ref 6–23)
CO2: 33 meq/L — ABNORMAL HIGH (ref 19–32)
Calcium: 10.4 mg/dL (ref 8.4–10.5)
Chloride: 99 meq/L (ref 96–112)
Creatinine, Ser: 1.27 mg/dL — ABNORMAL HIGH (ref 0.40–1.20)
GFR: 40.97 mL/min — ABNORMAL LOW (ref >60.00)
Glucose, Bld: 103 mg/dL — ABNORMAL HIGH (ref 70–99)
Potassium: 4.2 meq/L (ref 3.5–5.1)
Sodium: 139 meq/L (ref 135–145)
Total Bilirubin: 0.5 mg/dL (ref 0.2–1.2)
Total Protein: 7.8 g/dL (ref 6.0–8.3)

## 2024-02-21 LAB — TSH: TSH: 3.17 u[IU]/mL (ref 0.35–5.50)

## 2024-02-21 LAB — LIPID PANEL
Cholesterol: 187 mg/dL (ref 0–200)
HDL: 44.5 mg/dL (ref >39.00)
LDL Cholesterol: 116 mg/dL — ABNORMAL HIGH (ref 0–99)
NonHDL: 142.59
Total CHOL/HDL Ratio: 4
Triglycerides: 131 mg/dL (ref 0.0–149.0)
VLDL: 26.2 mg/dL (ref 0.0–40.0)

## 2024-02-21 NOTE — Patient Instructions (Signed)
 Nice to see you today We did update your flu vaccine today   Consider getting the tetanus (TDAP) and shingles vaccine (shingrix) at the local pharmacy   You can try low dose over the counter melatonin 1-3mg  at night   Call [facility] at  Address: [address], [place], [place] [address] Phone: [phone]  It is time for your repeat colonoscopy

## 2024-02-21 NOTE — Assessment & Plan Note (Signed)
 Discussed age-appropriate immunizations and screening exams.  Did review patient's personal, surgical, social, family histories.  Patient is up-to-date on all age-appropriate vaccinations she would like.  Update flu vaccine today.  Patient is overdue for CRC screening.  She was given information to call and set up with [facility].  Patient refused breast cancer screening and bone density screening.  Patient is aged out of cervical cancer screening.  Patient was given information at discharge about preventative healthcare maintenance with anticipatory guidance.

## 2024-02-21 NOTE — Progress Notes (Signed)
 Established Patient Office Visit  Subjective   Patient ID: Teresa Fry, female    DOB: May 08, 1945  Age: 78 y.o. MRN: [id number]  Chief Complaint  Patient presents with   Annual Exam    HPI  HTN: Patient currently maintained on amlodipine  10 mg daily, lisinopril  40 mg daily, spironolactone  25 mg daily. she does check her blood pressure at home approx once every 2 months  HLD: Currently maintained on lovastatin  10 mg daily  for complete physical and follow up of chronic conditions.  Immunizations: -Tetanus: Needs updating at local pharmacy -Influenza:  up date today  -Shingles: Get a local pharmacy -Pneumonia: Completed 2020  Diet: Fair diet. She is eating 2 meals a day. She will snack sometimes but not a lot.  Coffee, hot tea and water  Exercise: No regular exercise. She will do a bike that she will ride. She will also stretch some   Eye exam: PRN  Dental exam: Completes semi-annually    Colonoscopy: Completed in Cologuard 01/21/2020 that was positive. She thinks it was a couple years ago 12/09/2020 with [facility], recall 3 years  Lung Cancer Screening: N/A  Pap smear: Aged out  Mammogram: Overdue, patient declines currently  DEXA: 11/28/2021 with normal bone density.  Patient refused scan today  Sleep: going to bed until 1 and will get up aroudn 5 because she causes her son and her sleep is broken through the night. She iwll go back to sleep and then sleep awhile   Advance directive: does not have one       Review of Systems  Constitutional:  Negative for chills and fever.  Respiratory:  Negative for shortness of breath.   Cardiovascular:  Negative for chest pain and leg swelling.  Gastrointestinal:  Negative for abdominal pain, blood in stool, constipation, diarrhea, nausea and vomiting.       Bm daily   Genitourinary:  Negative for dysuria and hematuria.  Neurological:  Negative for dizziness, tingling and headaches.  Psychiatric/Behavioral:  Negative for  hallucinations and suicidal ideas.       Objective:     BP 102/60   Pulse 75   Temp 99 F (37.2 C) (Oral)   Ht 5' 5 (1.651 m)   Wt 186 lb 6.4 oz (84.6 kg)   SpO2 95%   BMI 31.02 kg/m  BP Readings from Last 3 Encounters:  02/21/24 102/60  01/30/24 118/66  11/13/23 118/86   Wt Readings from Last 3 Encounters:  02/21/24 186 lb 6.4 oz (84.6 kg)  01/30/24 186 lb (84.4 kg)  11/13/23 180 lb (81.6 kg)   SpO2 Readings from Last 3 Encounters:  02/21/24 95%  01/30/24 98%  09/26/23 95%      Physical Exam Vitals and nursing note reviewed.  Constitutional:      Appearance: Normal appearance.  HENT:     Right Ear: Tympanic membrane, ear canal and external ear normal.     Left Ear: Tympanic membrane, ear canal and external ear normal.     Mouth/Throat:     Mouth: Mucous membranes are moist.     Pharynx: Oropharynx is clear.  Eyes:     Extraocular Movements: Extraocular movements intact.     Pupils: Pupils are equal, round, and reactive to light.  Cardiovascular:     Rate and Rhythm: Normal rate and regular rhythm.     Pulses: Normal pulses.     Heart sounds: Normal heart sounds.  Pulmonary:     Effort: Pulmonary effort is  normal.     Breath sounds: Normal breath sounds.  Abdominal:     General: Bowel sounds are normal. There is no distension.     Palpations: There is no mass.     Tenderness: There is no abdominal tenderness.     Hernia: No hernia is present.  Musculoskeletal:     Right lower leg: No edema.     Left lower leg: No edema.  Lymphadenopathy:     Cervical: No cervical adenopathy.  Skin:    General: Skin is warm.  Neurological:     General: No focal deficit present.     Mental Status: She is alert.     Deep Tendon Reflexes:     Reflex Scores:      Bicep reflexes are 2+ on the right side and 2+ on the left side.      Patellar reflexes are 2+ on the right side and 2+ on the left side.    Comments: Bilateral upper and lower extremity strength 5/5   Psychiatric:        Mood and Affect: Mood normal.        Behavior: Behavior normal.        Thought Content: Thought content normal.        Judgment: Judgment normal.      No results found for any visits on 02/21/24.    The 10-year ASCVD risk score (Arnett DK, et al., 2019) is: 11.8%    Assessment & Plan:   Problem List Items Addressed This Visit       Cardiovascular and Mediastinum   Essential hypertension   Patient currently maintained amlodipine  10 mg daily, lisinopril  40 mg daily, spironolactone  25 mg daily.  Blood pressure controlled.  Continue medication as prescribed      Relevant Orders   CBC with Differential/Platelet   Comprehensive metabolic panel with GFR   TSH     Genitourinary   Stage 3 chronic kidney disease (HCC)   History of the same has been stable pending renal functions today      Relevant Orders   Comprehensive metabolic panel with GFR     Other   Hyperlipidemia   History of same currently maintained on lovastatin  10 mg daily.  Pending lipid panel      Relevant Orders   Lipid panel   Preventative health care - Primary   Discussed age-appropriate immunizations and screening exams.  Did review patient's personal, surgical, social, family histories.  Patient is up-to-date on all age-appropriate vaccinations she would like.  Update flu vaccine today.  Patient is overdue for CRC screening.  She was given information to call and set up with [facility].  Patient refused breast cancer screening and bone density screening.  Patient is aged out of cervical cancer screening.  Patient was given information at discharge about preventative healthcare maintenance with anticipatory guidance.      Relevant Orders   CBC with Differential/Platelet   Comprehensive metabolic panel with GFR   TSH    Return in about 1 year (around 02/20/2025) for CPE and Labs.    Adina Crandall, NP

## 2024-02-21 NOTE — Assessment & Plan Note (Signed)
 History of the same has been stable pending renal functions today

## 2024-02-21 NOTE — Assessment & Plan Note (Signed)
 Patient currently maintained amlodipine  10 mg daily, lisinopril  40 mg daily, spironolactone  25 mg daily.  Blood pressure controlled.  Continue medication as prescribed

## 2024-02-21 NOTE — Assessment & Plan Note (Signed)
 History of same currently maintained on lovastatin  10 mg daily.  Pending lipid panel

## 2024-02-22 ENCOUNTER — Ambulatory Visit: Payer: Self-pay | Admitting: Nurse Practitioner

## 2024-02-26 ENCOUNTER — Other Ambulatory Visit: Payer: Self-pay | Admitting: Nurse Practitioner

## 2024-02-26 DIAGNOSIS — Z87898 Personal history of other specified conditions: Secondary | ICD-10-CM

## 2024-03-11 ENCOUNTER — Other Ambulatory Visit: Payer: Self-pay | Admitting: Nurse Practitioner

## 2024-03-11 DIAGNOSIS — I10 Essential (primary) hypertension: Secondary | ICD-10-CM

## 2024-04-04 ENCOUNTER — Ambulatory Visit: Payer: Medicare (Managed Care)

## 2024-04-04 VITALS — BP 118/76 | Ht 65.0 in | Wt 191.6 lb

## 2024-04-04 DIAGNOSIS — Z0000 Encounter for general adult medical examination without abnormal findings: Secondary | ICD-10-CM

## 2024-04-04 NOTE — Addendum Note (Signed)
 Addended by: SEBASTIAN SHU on: 04/04/2024 02:35 PM   Modules accepted: Orders

## 2024-04-04 NOTE — Progress Notes (Signed)
 "  Chief Complaint  Patient presents with   Medicare Wellness     Subjective:   Teresa Fry is a 79 y.o. female who presents for a Medicare Annual Wellness Visit.  Visit info / Clinical Intake: Medicare Wellness Visit Type:: Subsequent Annual Wellness Visit Persons participating in visit and providing information:: patient Medicare Wellness Visit Mode:: In-person (required for WTM) Interpreter Needed?: No Pre-visit prep was completed: yes AWV questionnaire completed by patient prior to visit?: no Living arrangements:: lives with spouse/significant other Patient's Overall Health Status Rating: very good Typical amount of pain: none Does pain affect daily life?: no Are you currently prescribed opioids?: no  Dietary Habits and Nutritional Risks How many meals a day?: 2 Eats fruit and vegetables daily?: yes Most meals are obtained by: preparing own meals In the last 2 weeks, have you had any of the following?: none Diabetic:: no  Functional Status Activities of Daily Living (to include ambulation/medication): Independent Ambulation: Independent Medication Administration: Independent Home Management (perform basic housework or laundry): Independent Manage your own finances?: yes Primary transportation is: driving Concerns about vision?: no *vision screening is required for WTM* Concerns about hearing?: no  Fall Screening Falls in the past year?: 0 Number of falls in past year: 0 Was there an injury with Fall?: 0 Fall Risk Category Calculator: 0 Patient Fall Risk Level: Low Fall Risk  Fall Risk Patient at Risk for Falls Due to: No Fall Risks Fall risk Follow up: Falls evaluation completed; Education provided; Falls prevention discussed  Home and Transportation Safety: All rugs have non-skid backing?: N/A, no rugs All stairs or steps have railings?: yes Grab bars in the bathtub or shower?: (!) no Have non-skid surface in bathtub or shower?: yes (mat in shower) Good  home lighting?: yes Regular seat belt use?: yes Hospital stays in the last year:: no  Cognitive Assessment Difficulty concentrating, remembering, or making decisions? : no Will 6CIT or Mini Cog be Completed: yes What year is it?: 0 points What month is it?: 0 points Give patient an address phrase to remember (5 components): [address] [place]  About what time is it?: 0 points Count backwards from 20 to 1: 0 points Say the months of the year in reverse: 0 points Repeat the address phrase from earlier: 0 points 6 CIT Score: 0 points  Advance Directives (For Healthcare) Does Patient Have a Medical Advance Directive?: No Would patient like information on creating a medical advance directive?: Yes (MAU/Ambulatory/Procedural Areas - Information given)  Reviewed/Updated  Reviewed/Updated: Reviewed All (Medical, Surgical, Family, Medications, Allergies, Care Teams, Patient Goals)    Allergies (verified) Patient has no known allergies.   Current Medications (verified) Outpatient Encounter Medications as of 04/04/2024  Medication Sig   amLODipine  (NORVASC ) 10 MG tablet Take 1 tablet by mouth once daily   aspirin 81 MG tablet Take 81 mg by mouth daily.   celecoxib  (CELEBREX ) 100 MG capsule Take 1 capsule (100 mg total) by mouth daily as needed.   diclofenac  Sodium (VOLTAREN ) 1 % GEL Apply 2 g topically 3 (three) times daily as needed. To your shoulder   lisinopril  (ZESTRIL ) 40 MG tablet Take 1 tablet by mouth once daily   lovastatin  (MEVACOR ) 10 MG tablet TAKE 1 TABLET BY MOUTH AT BEDTIME   Magnesium 250 MG TABS Take by mouth daily.   meclizine  (ANTIVERT ) 12.5 MG tablet TAKE 1 TABLET BY MOUTH THREE TIMES DAILY AS NEEDED FOR DIZZINESS   MISC NATURAL PRODUCTS PO Take 1 tablet by mouth  daily. Neuriva   Multiple Vitamin (MULTIVITAMIN) capsule Take 1 capsule by mouth daily.   spironolactone  (ALDACTONE ) 25 MG tablet Take 1 tablet by mouth once daily   No facility-administered  encounter medications on file as of 04/04/2024.    History: Past Medical History:  Diagnosis Date   Gallstones    Hypertension    Past Surgical History:  Procedure Laterality Date   CHOLECYSTECTOMY  02/03/11   Family History  Problem Relation Age of Onset   Heart disease Father    Cancer Daughter 25       breast   Social History   Occupational History   Not on file  Tobacco Use   Smoking status: Never   Smokeless tobacco: Never  Vaping Use   Vaping status: Never Used  Substance and Sexual Activity   Alcohol use: No    Alcohol/week: 0.0 standard drinks of alcohol   Drug use: No   Sexual activity: Never   Tobacco Counseling Counseling given: Not Answered  SDOH Screenings   Food Insecurity: No Food Insecurity (04/04/2024)  Housing: Low Risk (04/04/2024)  Transportation Needs: No Transportation Needs (04/04/2024)  Utilities: Not At Risk (04/04/2024)  Alcohol Screen: Low Risk (10/11/2022)  Depression (PHQ2-9): Low Risk (04/04/2024)  Recent Concern: Depression (PHQ2-9) - Medium Risk (01/30/2024)  Financial Resource Strain: Low Risk (11/13/2023)  Physical Activity: Insufficiently Active (04/04/2024)  Social Connections: Moderately Integrated (04/04/2024)  Stress: No Stress Concern Present (04/04/2024)  Tobacco Use: Low Risk (04/04/2024)  Health Literacy: Adequate Health Literacy (04/04/2024)   See flowsheets for full screening details  Depression Screen PHQ 2 & 9 Depression Scale- Over the past 2 weeks, how often have you been bothered by any of the following problems? Little interest or pleasure in doing things: 0 Feeling down, depressed, or hopeless (PHQ Adolescent also includes...irritable): 0 PHQ-2 Total Score: 0 Trouble falling or staying asleep, or sleeping too much: 1 Feeling tired or having little energy: 1 Poor appetite or overeating (PHQ Adolescent also includes...weight loss): 1 Feeling bad about yourself - or that you are a failure or have let yourself or your family down:  0 Trouble concentrating on things, such as reading the newspaper or watching television (PHQ Adolescent also includes...like school work): 0 Moving or speaking so slowly that other people could have noticed. Or the opposite - being so fidgety or restless that you have been moving around a lot more than usual: 0 Thoughts that you would be better off dead, or of hurting yourself in some way: 0 PHQ-9 Total Score: 3 If you checked off any problems, how difficult have these problems made it for you to do your work, take care of things at home, or get along with other people?: Not difficult at all  Depression Treatment Depression Interventions/Treatment : Counseling     Goals Addressed             This Visit's Progress    I want to get a part-time job       I would like to do more exercise       COMPLETED: Patient Stated       08/08/2021, wants to lose weight     Patient Stated   Not on track    Lose 20 pounds.             Objective:    Today's Vitals   04/04/24 1140  BP: 118/76  Weight: 191 lb 9.6 oz (86.9 kg)  Height: 5' 5 (1.651 m)  Body mass index is 31.88 kg/m.  Hearing/Vision screen Vision Screening - Comments:: Not UTD w/eye visits:will make soon Immunizations and Health Maintenance Health Maintenance  Topic Date Due   DTaP/Tdap/Td (1 - Tdap) Never done   Zoster Vaccines- Shingrix (1 of 2) Never done   COVID-19 Vaccine (4 - 2025-26 season) 12/03/2023   Influenza Vaccine  07/01/2024 (Originally 11/02/2023)   Medicare Annual Wellness (AWV)  11/12/2024   Pneumococcal Vaccine: 50+ Years  Completed   Bone Density Scan  Completed   Hepatitis C Screening  Completed   Meningococcal B Vaccine  Aged Out   Fecal DNA (Cologuard)  Discontinued        Assessment/Plan:  This is a routine wellness examination for John.  Patient Care Team: Wendee Lynwood HERO, NP as PCP - General  I have personally reviewed and noted the following in the patients chart:   Medical and  social history Use of alcohol, tobacco or illicit drugs  Current medications and supplements including opioid prescriptions. Functional ability and status Nutritional status Physical activity Advanced directives List of other physicians Hospitalizations, surgeries, and ER visits in previous 12 months Vitals Screenings to include cognitive, depression, and falls Referrals and appointments  No orders of the defined types were placed in this encounter.  In addition, I have reviewed and discussed with patient certain preventive protocols, quality metrics, and best practice recommendations. A written personalized care plan for preventive services as well as general preventive health recommendations were provided to patient.   Erminio LITTIE Saris, LPN   11/08/7971    After Visit Summary: (In Person-Declined) Patient declined AVS at this time.Pt says will view in myChart  Nurse Notes: No voiced or noted concerns at this time Appointment(s) made: (AWV/CPE Jan 2027) HM Addressed: Pt indicates she had the Influenza vaccine and does not desire the Covid vaccine or Shingles at this time  "

## 2024-04-04 NOTE — Patient Instructions (Signed)
 Ms. Lastra,  Thank you for taking the time for your Medicare Wellness Visit. I appreciate your continued commitment to your health goals. Please review the care plan we discussed, and feel free to reach out if I can assist you further.  Please note that Annual Wellness Visits do not include a physical exam. Some assessments may be limited, especially if the visit was conducted virtually. If needed, we may recommend an in-person follow-up with your provider.  Ongoing Care Seeing your primary care provider every 3 to 6 months helps us  monitor your health and provide consistent, personalized care.   Referrals If a referral was made during today's visit and you haven't received any updates within two weeks, please contact the referred provider directly to check on the status.  Recommended Screenings:  Health Maintenance  Topic Date Due   DTaP/Tdap/Td vaccine (1 - Tdap) Never done   Zoster (Shingles) Vaccine (1 of 2) Never done   COVID-19 Vaccine (4 - 2025-26 season) 12/03/2023   Flu Shot  07/01/2024*   Medicare Annual Wellness Visit  11/12/2024   Pneumococcal Vaccine for age over 51  Completed   Osteoporosis screening with Bone Density Scan  Completed   Hepatitis C Screening  Completed   Meningitis B Vaccine  Aged Out   Cologuard (Stool DNA test)  Discontinued  *Topic was postponed. The date shown is not the original due date.       04/04/2024   11:46 AM  Advanced Directives  Does Patient Have a Medical Advance Directive? No  Would patient like information on creating a medical advance directive? Yes (MAU/Ambulatory/Procedural Areas - Information given)    Vision: Annual vision screenings are recommended for early detection of glaucoma, cataracts, and diabetic retinopathy. These exams can also reveal signs of chronic conditions such as diabetes and high blood pressure.  Dental: Annual dental screenings help detect early signs of oral cancer, gum disease, and other conditions linked  to overall health, including heart disease and diabetes.

## 2025-04-07 ENCOUNTER — Encounter: Payer: Medicare (Managed Care) | Admitting: Nurse Practitioner

## 2025-04-07 ENCOUNTER — Ambulatory Visit: Payer: Medicare (Managed Care)
